# Patient Record
Sex: Male | Born: 1975
Health system: Southern US, Community
[De-identification: ages and names within clinical notes are randomized; demographics above are authoritative.]

## PROBLEM LIST (undated history)

## (undated) DIAGNOSIS — R42 Dizziness and giddiness: Secondary | ICD-10-CM

## (undated) DIAGNOSIS — B019 Varicella without complication: Secondary | ICD-10-CM

## (undated) DIAGNOSIS — K625 Hemorrhage of anus and rectum: Secondary | ICD-10-CM

## (undated) DIAGNOSIS — R51 Headache: Secondary | ICD-10-CM

## (undated) DIAGNOSIS — R519 Headache, unspecified: Secondary | ICD-10-CM

## (undated) HISTORY — DX: Headache: R51

## (undated) HISTORY — DX: Hemorrhage of anus and rectum: K62.5

## (undated) HISTORY — DX: Varicella without complication: B01.9

## (undated) HISTORY — DX: Headache, unspecified: R51.9

## (undated) HISTORY — DX: Dizziness and giddiness: R42

---

## 2014-05-26 ENCOUNTER — Emergency Department: Payer: Self-pay | Admitting: Emergency Medicine

## 2015-06-23 ENCOUNTER — Emergency Department
Admission: EM | Admit: 2015-06-23 | Discharge: 2015-06-24 | Disposition: A | Discharge status: Home / Self Care | Payer: BLUE CROSS/BLUE SHIELD | Attending: Emergency Medicine | Admitting: Emergency Medicine

## 2015-06-23 ENCOUNTER — Encounter: Payer: Self-pay | Admitting: Emergency Medicine

## 2015-06-23 DIAGNOSIS — R42 Dizziness and giddiness: Secondary | ICD-10-CM | POA: Diagnosis present

## 2015-06-23 MED ORDER — SODIUM CHLORIDE 0.9 % IV BOLUS (SEPSIS)
1000.0000 mL | Freq: Once | INTRAVENOUS | Status: AC
  Administered 2015-06-23: 1000 mL via INTRAVENOUS

## 2015-06-23 NOTE — ED Provider Notes (Signed)
[facility] Emergency Department Provider Note  ____________________________________________  Time seen: 11:30 PM  I have reviewed the triage vital signs and the nursing notes.   HISTORY  Chief Complaint Dizziness     HPI Tyler Salinas is a 39 y.o. male presents with acute onset of dizziness this evening while at home. Patient states he was sitting at a time with no change in position. Patient denies any vomiting or diarrhea. Patient denies any strenuous activity today normal by mouth intake. Patient denies any preceding symptoms no chest pain or palpitations. Patient does admit to headaches weekly for which she's never been evaluated. Patient denies any family history of cerebral aneurysms or cardiac disease. Patient denies any weakness numbness or visual changes.    Past medical history None There are no active problems to display for this patient.   Past surgical history None No current outpatient prescriptions on file.  Allergies Review of patient's allergies indicates no known allergies.  No family history on file.  Social History Social History  Substance Use Topics  . Smoking status: Never Smoker   . Smokeless tobacco: None  . Alcohol Use: No    Review of Systems  Constitutional: Negative for fever. Eyes: Negative for visual changes. ENT: Negative for sore throat. Cardiovascular: Negative for chest pain. Respiratory: Negative for shortness of breath. Gastrointestinal: Negative for abdominal pain, vomiting and diarrhea. Genitourinary: Negative for dysuria. Musculoskeletal: Negative for back pain. Skin: Negative for rash. Neurological: Negative for headaches, focal weakness or numbness. Positive for dizziness   10-point ROS otherwise negative.  ____________________________________________   PHYSICAL EXAM:  VITAL SIGNS: ED Triage Vitals  Enc Vitals Group     BP 06/23/15 2246 133/72 mmHg     Pulse Rate 06/23/15 2246 77   Resp 06/23/15 2246 18     Temp 06/23/15 2246 98.1 F (36.7 C)     Temp Source 06/23/15 2246 Oral     SpO2 06/23/15 2246 98 %     Weight 06/23/15 2246 145 lb (65.772 kg)     Height 06/23/15 2246  (1.702 m)     Head Cir --      Peak Flow --      Pain Score --      Pain Loc --      Pain Edu? --      Excl. in GC? --      Constitutional: Alert and oriented. Well appearing and in no distress. Eyes: Conjunctivae are normal. PERRL. Normal extraocular movements. ENT   Head: Normocephalic and atraumatic.   Nose: No congestion/rhinnorhea.   Mouth/Throat: Mucous membranes are moist.   Neck: No stridor. Hematological/Lymphatic/Immunilogical: No cervical lymphadenopathy. Cardiovascular: Normal rate, regular rhythm. Normal and symmetric distal pulses are present in all extremities. No murmurs, rubs, or gallops. Respiratory: Normal respiratory effort without tachypnea nor retractions. Breath sounds are clear and equal bilaterally. No wheezes/rales/rhonchi. Gastrointestinal: Soft and nontender. No distention. There is no CVA tenderness. Genitourinary: deferred Musculoskeletal: Nontender with normal range of motion in all extremities. No joint effusions.  No lower extremity tenderness nor edema. Neurologic:  Normal speech and language. No gross focal neurologic deficits are appreciated. Speech is normal.  Skin:  Skin is warm, dry and intact. No rash noted. Psychiatric: Mood and affect are normal. Speech and behavior are normal. Patient exhibits appropriate insight and judgment.  ____________________________________________    LABS (pertinent positives/negatives)  Labs Reviewed  COMPREHENSIVE METABOLIC PANEL - Abnormal; Notable for the following:    Glucose, Bld 118 (*)  Creatinine, Ser 1.34 (*)    All other components within normal limits  CBC  TROPONIN I     ____________________________________________   EKG  ED ECG REPORT I, BROWN, [place] N, the attending  physician, personally viewed and interpreted this ECG.   Date: 06/24/2015  EKG Time: 10:55 PM  Rate: 65  Rhythm: Normal sinus rhythm  Axis: None  Intervals: Normal  ST&T Change: None   ____________________________________________    RADIOLOGY  CT Angio Head W/Cm &/Or Wo Cm (Final result) Result time: 06/24/15 02:06:39   Final result by Rad Results In Interface (06/24/15 02:06:39)   Narrative:   CLINICAL DATA: Dizziness and nausea beginning this evening.  EXAM: CT ANGIOGRAPHY HEAD  TECHNIQUE: Multidetector CT imaging of the head was performed using the standard protocol during bolus administration of intravenous contrast. Multiplanar CT image reconstructions and MIPs were obtained to evaluate the vascular anatomy.  CONTRAST: 100mL OMNIPAQUE IOHEXOL 350 MG/ML SOLN  COMPARISON: None.  FINDINGS: CT HEAD  The ventricles and sulci are normal. No intraparenchymal hemorrhage, mass effect nor midline shift. No acute large vascular territory infarcts. No abnormal intracranial enhancement.  No abnormal extra-axial fluid collections. Basal cisterns are patent.  No skull fracture. The included ocular globes and orbital contents are non-suspicious. The mastoid aircells and included paranasal sinuses are well-aerated.  CTA HEAD  Anterior circulation: Normal appearance of the cervical internal carotid arteries, petrous, cavernous and supra clinoid internal carotid arteries. Widely patent anterior communicating artery. Normal appearance of the anterior and middle cerebral arteries.  Posterior circulation: Codominant vertebral arteries with normal appearance of the vertebral arteries, vertebrobasilar junction and basilar artery, as well as main branch vessels. Normal appearance of the posterior cerebral arteries.  No large vessel occlusion, hemodynamically significant stenosis, dissection, luminal irregularity, contrast extravasation or aneurysm within the anterior  nor posterior circulation.  IMPRESSION: Normal CT head with and without contrast.  Normal CTA head.   Electronically Signed By: Awilda Metroourtnay Bloomer M.D. On: 06/24/2015 02:06      INITIAL IMPRESSION / ASSESSMENT AND PLAN / ED COURSE  Pertinent labs & imaging results that were available during my care of the patient were reviewed by me and considered in my medical decision making (see chart for details).  No clear etiology noted for the patient's dizziness. Consider possibility of vertigo as such patient received meclizine 25 mg. In addition patient's creatinine 1.34 question possibility of dehydration. Patient received 1 L normal saline. Patient reassessed before discharge in the emergency department and stated that his symptoms are significantly improved following 1 L IV normal saline ____________________________________________   FINAL CLINICAL IMPRESSION(S) / ED DIAGNOSES  Final diagnoses:  Dizziness      Darci Currentandolph N Brown, MD 06/24/15 [phone]

## 2015-06-23 NOTE — ED Notes (Signed)
MD at bedside. 

## 2015-06-23 NOTE — ED Notes (Signed)
Patient ambulatory to triage with steady gait, without difficulty or distress noted; pt reports onset dizziness this evening accomp by nausea

## 2015-06-24 ENCOUNTER — Emergency Department: Payer: BLUE CROSS/BLUE SHIELD

## 2015-06-24 ENCOUNTER — Encounter: Payer: Self-pay | Admitting: Radiology

## 2015-06-24 LAB — COMPREHENSIVE METABOLIC PANEL
ALT: 32 U/L (ref 17–63)
AST: 34 U/L (ref 15–41)
Albumin: 4.4 g/dL (ref 3.5–5.0)
Alkaline Phosphatase: 58 U/L (ref 38–126)
Anion gap: 5 (ref 5–15)
BUN: 19 mg/dL (ref 6–20)
CO2: 29 mmol/L (ref 22–32)
Calcium: 9.1 mg/dL (ref 8.9–10.3)
Chloride: 105 mmol/L (ref 101–111)
Creatinine, Ser: 1.34 mg/dL — ABNORMAL HIGH (ref 0.61–1.24)
GFR calc Af Amer: >60 mL/min (ref >60)
GFR calc non Af Amer: >60 mL/min (ref >60)
Glucose, Bld: 118 mg/dL — ABNORMAL HIGH (ref 65–99)
Potassium: 3.9 mmol/L (ref 3.5–5.1)
Sodium: 139 mmol/L (ref 135–145)
Total Bilirubin: 0.5 mg/dL (ref 0.3–1.2)
Total Protein: 6.7 g/dL (ref 6.5–8.1)

## 2015-06-24 LAB — CBC
HCT: 43.5 % (ref 40.0–52.0)
Hemoglobin: 15 g/dL (ref 13.0–18.0)
MCH: 30.8 pg (ref 26.0–34.0)
MCHC: 34.4 g/dL (ref 32.0–36.0)
MCV: 89.6 fL (ref 80.0–100.0)
Platelets: 182 10*3/uL (ref 150–440)
RBC: 4.85 MIL/uL (ref 4.40–5.90)
RDW: 12.8 % (ref 11.5–14.5)
WBC: 5.3 10*3/uL (ref 3.8–10.6)

## 2015-06-24 LAB — TROPONIN I: Troponin I: <0.03 ng/mL (ref <0.031)

## 2015-06-24 MED ORDER — MECLIZINE HCL 25 MG PO TABS
25.0000 mg | ORAL_TABLET | Freq: Once | ORAL | Status: AC
  Administered 2015-06-24: 25 mg via ORAL
  Filled 2015-06-24: qty 1

## 2015-06-24 MED ORDER — IOHEXOL 350 MG/ML SOLN
100.0000 mL | Freq: Once | INTRAVENOUS | Status: AC | PRN
  Administered 2015-06-24: 100 mL via INTRAVENOUS

## 2015-06-24 MED ORDER — MECLIZINE HCL 25 MG PO TABS: 25.0000 mg | ORAL_TABLET | Freq: Three times a day (TID) | ORAL | Status: DC | PRN

## 2015-06-24 NOTE — Discharge Instructions (Signed)

## 2015-07-06 ENCOUNTER — Telehealth: Payer: Self-pay | Admitting: Emergency Medicine

## 2015-07-06 NOTE — ED Notes (Signed)
Pt had called me asking for referral to ent at [facility] as his dizziness continues.  He has made appt with ent and with a new pcp, but the pcp appt is later.  i called [facility] ent and they cannot see the care everywhere and need faxed information.  i sent the labs and ct and the md notes to Ether GriffinsJosh Smith PA--this is who will be seeing the patient.

## 2015-07-21 DIAGNOSIS — G43809 Other migraine, not intractable, without status migrainosus: Secondary | ICD-10-CM | POA: Insufficient documentation

## 2015-07-27 ENCOUNTER — Ambulatory Visit (INDEPENDENT_AMBULATORY_CARE_PROVIDER_SITE_OTHER): Payer: BLUE CROSS/BLUE SHIELD | Admitting: Family Medicine

## 2015-07-27 ENCOUNTER — Encounter: Payer: Self-pay | Admitting: Family Medicine

## 2015-07-27 VITALS — BP 112/78 | HR 66 | Temp 98.7°F | Ht 68.7 in | Wt 161.8 lb

## 2015-07-27 DIAGNOSIS — R42 Dizziness and giddiness: Secondary | ICD-10-CM | POA: Insufficient documentation

## 2015-07-27 DIAGNOSIS — R7309 Other abnormal glucose: Secondary | ICD-10-CM

## 2015-07-27 DIAGNOSIS — R748 Abnormal levels of other serum enzymes: Secondary | ICD-10-CM | POA: Diagnosis not present

## 2015-07-27 DIAGNOSIS — Z13220 Encounter for screening for lipoid disorders: Secondary | ICD-10-CM

## 2015-07-27 DIAGNOSIS — G44209 Tension-type headache, unspecified, not intractable: Secondary | ICD-10-CM

## 2015-07-27 DIAGNOSIS — R739 Hyperglycemia, unspecified: Secondary | ICD-10-CM

## 2015-07-27 DIAGNOSIS — R7989 Other specified abnormal findings of blood chemistry: Secondary | ICD-10-CM

## 2015-07-27 LAB — LIPID PANEL
Cholesterol: 195 mg/dL (ref 0–200)
HDL: 47.4 mg/dL (ref >39.00)
LDL Cholesterol: 128 mg/dL — ABNORMAL HIGH (ref 0–99)
NonHDL: 147.2
Total CHOL/HDL Ratio: 4
Triglycerides: 94 mg/dL (ref 0.0–149.0)
VLDL: 18.8 mg/dL (ref 0.0–40.0)

## 2015-07-27 LAB — COMPREHENSIVE METABOLIC PANEL
ALT: 28 U/L (ref 0–53)
AST: 25 U/L (ref 0–37)
Albumin: 4.3 g/dL (ref 3.5–5.2)
Alkaline Phosphatase: 59 U/L (ref 39–117)
BUN: 16 mg/dL (ref 6–23)
CO2: 28 mEq/L (ref 19–32)
Calcium: 9.6 mg/dL (ref 8.4–10.5)
Chloride: 103 mEq/L (ref 96–112)
Creatinine, Ser: 1.03 mg/dL (ref 0.40–1.50)
GFR: 86.13 mL/min (ref >60.00)
Glucose, Bld: 83 mg/dL (ref 70–99)
Potassium: 4.7 mEq/L (ref 3.5–5.1)
Sodium: 138 mEq/L (ref 135–145)
Total Bilirubin: 0.5 mg/dL (ref 0.2–1.2)
Total Protein: 6.9 g/dL (ref 6.0–8.3)

## 2015-07-27 LAB — HEMOGLOBIN A1C: Hgb A1c MFr Bld: 5.3 % (ref 4.6–6.5)

## 2015-07-27 NOTE — Progress Notes (Signed)
Pre visit review using our clinic review tool, if applicable. No additional management support is needed unless otherwise documented below in the visit note. 

## 2015-07-27 NOTE — Assessment & Plan Note (Signed)
Headaches are tension in nature. He is neurologically intact. 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 He can continue ibuprofen as needed for headaches. He is advised to monitor for additional symptoms. He is given return precautions.

## 2015-07-27 NOTE — Patient Instructions (Signed)
Nice to meet you. Please continue to monitor your lightheadedness. If it recurs please let us know. You can continue to take ibuprofen as needed for headaches. You should wear a mask while around solvents and pains at work. If your headaches change or become more frequent he should follow-up with us. If you develop numbness, weakness, vision changes, light sensitivity, sound sensitivity, sudden onset headache, persistent dizziness, chest pain, shortness breath, or palpitations, or any new or change in symptoms please seek medical attention.

## 2015-07-27 NOTE — Progress Notes (Signed)
Patient ID: Tyler Salinas, male   DOB: [birth year], 39 y.o.   MRN: [id number]  Tyler Rumps, MD Phone: [phone]  Tyler Salinas is a 39 y.o. male who presents today for new patient visit.  Dizziness: Patient notes an episode of dizziness in early November. He notes he was sitting there and became lightheaded and had a spinning sensation. He had not changed positions. He notes this lasted for an hour. He had no tinnitus, ear fullness, or ear pain. He had no chest pain, shortness of breath, or palpitations. He had no headache with this. He notes after this lasted for about half an hour he went to the emergency room to be evaluated for this. He had a CT angiogram of his head that did not reveal a cause. He had an EKG that did not reveal a cause. He had a troponin that was negative. He had a CMP that revealed a mildly elevated blood sugar and a mildly elevated creatinine. He had a negative CBC. He received 1 L of normal saline and noted his symptoms improved. Since that time he has had one additional episode lasting about 20 minutes. This occurred a little less than a month ago. It occurred while he was standing still. He had no other symptoms with this. He notes he took Antivert and this was beneficial. After the second episode he saw [facility] for evaluation. States they advised him that this is likely related to migraines. He does note he has a history of headaches for several years. He notes they are bitemporal and aching in nature. They're gradual in onset. They're associated with him feeling fatigued from work, tired, or stressed. They're not associated with photophobia or phonophobia. He has no numbness, weakness, or vision changes with these. He notes they resolve with taking aspirin or ibuprofen. He notes the headaches occur a few times a week depending on how much sleep and stress he is under at work. 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  Active Ambulatory Problems    Diagnosis Date Noted  . Dizziness 07/27/2015  . Tension headache 07/27/2015   Resolved Ambulatory Problems    Diagnosis Date Noted  . No Resolved Ambulatory Problems   Past Medical History  Diagnosis Date  . Chickenpox   . Headache   . Vertigo     Family History  Problem Relation Age of Onset  . Diabetes      Grandparent    Social History   Social History  . Marital Status: Married    Spouse Name: N/A  . Number of Children: N/A  . Years of Education: N/A   Occupational History  . Not on file.   Social History Main Topics  . Smoking status: Former Research scientist (life sciences)  . Smokeless tobacco: Not on file  . Alcohol Use: 0.6 oz/week    1 Standard drinks or equivalent per week  . Drug Use: No  . Sexual Activity: Not on file   Other Topics Concern  . Not on file   Social History Narrative    ROS   General:  Negative for nexplained weight loss, fever Skin: Negative for new or changing mole, sore that won't heal HEENT: Negative for trouble hearing, trouble seeing, ringing in ears, mouth sores, hoarseness, change in voice, dysphagia. CV:  Negative for chest pain, dyspnea, edema, palpitations Resp: Negative for cough, dyspnea, hemoptysis GI: Negative for nausea,  vomiting, diarrhea, constipation, abdominal pain, melena, hematochezia. GU: Negative for dysuria, incontinence, urinary hesitance, hematuria, vaginal or penile discharge, polyuria, sexual difficulty, lumps in testicle or breasts MSK: Negative for muscle cramps or aches, joint pain or swelling Neuro: Positive for headaches and dizziness, Negative for  weakness, numbness, passing out/fainting Psych: Negative for depression, anxiety, memory problems    Objective  Physical Exam Filed Vitals:   07/27/15 0904  BP: 112/78  Pulse: 66  Temp: 98.7 F (37.1 C)   Laying blood pressure 106/78 pulse 65  Sitting blood pressure 104/76 pulse 69 Standing blood pressure  108/84 pulse 66  Physical Exam  Constitutional: He is well-developed, well-nourished, and in no distress.  HENT:  Head: Normocephalic and atraumatic.  Right Ear: External ear normal.  Left Ear: External ear normal.  Mouth/Throat: Oropharynx is clear and moist. No oropharyngeal exudate.  Normal TMs bilaterally  Eyes: Conjunctivae are normal. Pupils are equal, round, and reactive to light.  Neck: Neck supple.  Cardiovascular: Normal rate, regular rhythm and normal heart sounds.  Exam reveals no gallop and no friction rub.   No murmur heard. Pulmonary/Chest: Effort normal and breath sounds normal. No respiratory distress. He has no wheezes. He has no rales.  Abdominal: Soft. Bowel sounds are normal. He exhibits no distension. There is no tenderness. There is no rebound and no guarding.  Musculoskeletal: He exhibits no edema.  Lymphadenopathy:    He has no cervical adenopathy.  Neurological: He is alert.  CN 2-12 intact, 5/5 strength in bilateral biceps, triceps, grip, quads, hamstrings, plantar and dorsiflexion, sensation to light touch intact in bilateral UE and LE, normal gait, 2+ patellar reflexes  Skin: Skin is warm and dry. He is not diaphoretic.  Psychiatric: Mood and affect normal.     Assessment/Plan:   Dizziness Patient describes sensation of possible lightheadedness versus possible vertigo. He has no associated symptoms with this. He underwent extensive workup in the ED for this without cause found and has also seen ENT. Patient states ENT advised that he could undergo vestibular testing to further evaluate this, though they wanted him to be evaluated for his headaches first. This issue does not seem to be associated with his headaches. His headaches are not typical of migraine headaches. They are most typical of tension headaches. He is neurologically intact today. Discussed with patient that this could be related to dehydration given his elevated creatinine in the ED and  improvement of the initial episode with hydration. Also discussed that this could be a vestibular issue. Doubt central nervous system process given negative CT of head and normal neurological exam. Doubt cardiac cause given lack of cardiac symptoms and negative EKG. Discussed potentially going back to ENT to complete vestibular workup versus monitoring. Patient opts to monitor at this time. If this recurs would have him complete vestibular workup with ENT. If that is negative would consider Holter monitor versus neurology consult, though cardiac and neurological causes seem less likely. He is given return precautions.  Tension headache Headaches are tension in nature. He is neurologically intact. 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 He can continue ibuprofen as needed for headaches. He is advised to monitor for additional symptoms. He is given return precautions.   we will check a CMP and A1c to follow-up on his lab work from the ED.  Orders Placed This Encounter  Procedures  . Comp Met (CMET)  . Lipid Profile  .  HgB A1c    Tyler Salinas

## 2015-07-27 NOTE — Assessment & Plan Note (Signed)
Patient describes sensation of possible lightheadedness versus possible vertigo. He has no associated symptoms with this. He underwent extensive workup in the ED for this without cause found and has also seen ENT. Patient states ENT advised that he could undergo vestibular testing to further evaluate this, though they wanted him to be evaluated for his headaches first. This issue does not seem to be associated with his headaches. His headaches are not typical of migraine headaches. They are most typical of tension headaches. He is neurologically intact today. Discussed with patient that this could be related to dehydration given his elevated creatinine in the ED and improvement of the initial episode with hydration. Also discussed that this could be a vestibular issue. Doubt central nervous system process given negative CT of head and normal neurological exam. Doubt cardiac cause given lack of cardiac symptoms and negative EKG. Discussed potentially going back to ENT to complete vestibular workup versus monitoring. Patient opts to monitor at this time. If this recurs would have him complete vestibular workup with ENT. If that is negative would consider Holter monitor versus neurology consult, though cardiac and neurological causes seem less likely. He is given return precautions.

## 2015-10-29 ENCOUNTER — Encounter: Payer: Self-pay | Admitting: Family Medicine

## 2015-10-29 ENCOUNTER — Ambulatory Visit (INDEPENDENT_AMBULATORY_CARE_PROVIDER_SITE_OTHER): Payer: BLUE CROSS/BLUE SHIELD | Admitting: Family Medicine

## 2015-10-29 VITALS — BP 102/78 | HR 67 | Temp 98.2°F | Wt 161.1 lb

## 2015-10-29 DIAGNOSIS — R42 Dizziness and giddiness: Secondary | ICD-10-CM

## 2015-10-29 DIAGNOSIS — G44209 Tension-type headache, unspecified, not intractable: Secondary | ICD-10-CM | POA: Diagnosis not present

## 2015-10-29 DIAGNOSIS — J069 Acute upper respiratory infection, unspecified: Secondary | ICD-10-CM | POA: Diagnosis not present

## 2015-10-29 DIAGNOSIS — E7800 Pure hypercholesterolemia, unspecified: Secondary | ICD-10-CM | POA: Diagnosis not present

## 2015-10-29 LAB — LDL CHOLESTEROL, DIRECT: Direct LDL: 114 mg/dL

## 2015-10-29 NOTE — Progress Notes (Signed)
Pre visit review using our clinic review tool, if applicable. No additional management support is needed unless otherwise documented below in the visit note. 

## 2015-10-29 NOTE — Assessment & Plan Note (Signed)
The significantly improved. Asymptomatic recently. He'll continue to monitor. Antivert as needed. Given return precautions.

## 2015-10-29 NOTE — Assessment & Plan Note (Signed)
Infrequently occurring. Patient will continue to monitor. He is neurologically intact. Given return precautions.

## 2015-10-29 NOTE — Assessment & Plan Note (Signed)
Symptoms and exam consistent with most likely viral upper respiratory infection, less likely allergic rhinitis. Patient is taking Benadryl well with this. He'll continue to monitor. As needed Benadryl. Given return precautions.

## 2015-10-29 NOTE — Assessment & Plan Note (Signed)
Mildly elevated LDL on last check. Patient is changed his diet. He'll continue to be physically active. We will check a direct LDL today.

## 2015-10-29 NOTE — Progress Notes (Signed)
Patient ID: Tyler Salinas, male   DOB: 04/02/1976, 40 y.o.   MRN: [id number]  Tyler AlarEric Anel Creighton, MD Phone: [phone]  Tyler Salinas is a 40 y.o. male who presents today for follow-up.  Elevated LDL: Patient reports that he has changed his diet by not eating out for lunch. He is typically taking sandwich, apples, and carrots for lunch. Notes he is quite physical at his job though does not do specific exercise. They're also cooking more at home. He denies chest pain or shortness of breath.  Tension headaches: Patient notes this has not been much of an issue since his last visit. Notes very infrequently he gets them with a bitemporal achy sensation. No numbness, weakness, or vision changes with these.  Lightheadedness: This is significantly improved as well. Very infrequently he'll get them at the end of the day if he is stressed out or too tired. Lasts very briefly and goes away with sitting down and drinking water. He has not had to take any of the Antivert. He gets no other symptoms with this. No chest pain, short of breath or palpitations with this.  Upper respiratory infection: Patient notes yesterday started with scratchy itchy throat and postnasal drip with some congestion. No fevers. No ear discomfort. No cough. No shortness of breath. Notes his household has been sick recently. He does not have a history of allergies.  PMH: Former smoker   ROS see history of present illness  Objective  Physical Exam Filed Vitals:   10/29/15 0800  BP: 102/78  Pulse: 67  Temp: 98.2 F (36.8 C)    BP Readings from Last 3 Encounters:  10/29/15 102/78  07/27/15 112/78  06/24/15 114/71   Wt Readings from Last 3 Encounters:  10/29/15 161 lb 2 oz (73.086 kg)  07/27/15 161 lb 12.8 oz (73.392 kg)  06/23/15 145 lb (65.772 kg)    Physical Exam  Constitutional: He is well-developed, well-nourished, and in no distress.  HENT:  Head: Normocephalic and atraumatic.  Right Ear: External ear normal.  Left  Ear: External ear normal.  Mouth/Throat: No oropharyngeal exudate.  Normal TMs bilaterally, mild posterior oropharyngeal erythema with postnasal drip  Eyes: Conjunctivae are normal. Pupils are equal, round, and reactive to light.  Neck: Neck supple.  Cardiovascular: Normal rate, regular rhythm and normal heart sounds.  Exam reveals no gallop and no friction rub.   No murmur heard. Pulmonary/Chest: Effort normal and breath sounds normal. No respiratory distress. He has no wheezes. He has no rales.  Lymphadenopathy:    He has no cervical adenopathy.  Neurological: He is alert.  CN 2-12 intact, 5/5 strength in bilateral biceps, triceps, grip, quads, hamstrings, plantar and dorsiflexion, sensation to light touch intact in bilateral UE and LE, normal gait, 2+ patellar reflexes  Skin: Skin is warm and dry. He is not diaphoretic.     Assessment/Plan: Please see individual problem list.  Dizziness The significantly improved. Asymptomatic recently. He'll continue to monitor. Antivert as needed. Given return precautions.  Tension headache Infrequently occurring. Patient will continue to monitor. He is neurologically intact. Given return precautions.  Elevated LDL cholesterol level Mildly elevated LDL on last check. Patient is changed his diet. He'll continue to be physically active. We will check a direct LDL today.  URI (upper respiratory infection) Symptoms and exam consistent with most likely viral upper respiratory infection, less likely allergic rhinitis. Patient is taking Benadryl well with this. He'll continue to monitor. As needed Benadryl. Given return precautions.    Orders Placed  This Encounter  Procedures  . Direct LDL     Tyler Alar, MD [facility]

## 2015-10-29 NOTE — Patient Instructions (Signed)
Nice to see you. Great job with diet. Please continue this. Please continue to be physically active as well. Monitor your upper respiratory symptoms and if they worsen or you develop fevers or persistent symptoms please let us know. Monitor for headaches and lightheadedness and if these become more persistent or prominent please let us know as well. If you develop numbness, weakness, vision changes, persistent lightheadedness, chest pain, shortness breath, palpitations, or any new or changing symptoms please seek medical attention.

## 2016-05-05 ENCOUNTER — Ambulatory Visit: Payer: BLUE CROSS/BLUE SHIELD | Admitting: Family Medicine

## 2016-06-02 ENCOUNTER — Encounter: Payer: Self-pay | Admitting: Family Medicine

## 2016-06-02 ENCOUNTER — Ambulatory Visit (INDEPENDENT_AMBULATORY_CARE_PROVIDER_SITE_OTHER): Payer: BLUE CROSS/BLUE SHIELD | Admitting: Family Medicine

## 2016-06-02 ENCOUNTER — Encounter: Payer: Self-pay | Admitting: *Deleted

## 2016-06-02 VITALS — BP 110/76 | HR 57 | Temp 98.3°F | Wt 158.4 lb

## 2016-06-02 DIAGNOSIS — Z23 Encounter for immunization: Secondary | ICD-10-CM

## 2016-06-02 DIAGNOSIS — E7800 Pure hypercholesterolemia, unspecified: Secondary | ICD-10-CM | POA: Diagnosis not present

## 2016-06-02 LAB — LDL CHOLESTEROL, DIRECT: Direct LDL: 113 mg/dL

## 2016-06-02 NOTE — Patient Instructions (Signed)
Nice to see you. We will check your cholesterol today. We'll call you with the results.

## 2016-06-02 NOTE — Assessment & Plan Note (Signed)
Asymptomatic. He'll continue to work on diet and exercise. We'll check a direct LDL today.

## 2016-06-02 NOTE — Progress Notes (Signed)
  Marikay AlarEric Araceli Arango, MD Phone: [phone]  Roe Rutherfordric Anguiano is a 40 y.o. male who presents today for follow-up.  Elevated LDL cholesterol: Patient notes his diet is a same as previously. Over the summer and this fall he started walking and pushing his son in a tricycle. Did this most days. Walked for about a mile. No chest pain or shortness of breath.  Lightheadedness: Has not had any recurrence. Has not had to use Antivert.  ROS see history of present illness  Objective  Physical Exam Vitals:   06/02/16 1039  BP: 110/76  Pulse: (!) 57  Temp: 98.3 F (36.8 C)    BP Readings from Last 3 Encounters:  06/02/16 110/76  10/29/15 102/78  07/27/15 112/78   Wt Readings from Last 3 Encounters:  06/02/16 158 lb 6.4 oz (71.8 kg)  10/29/15 161 lb 2 oz (73.1 kg)  07/27/15 161 lb 12.8 oz (73.4 kg)    Physical Exam  Constitutional: He is well-developed, well-nourished, and in no distress.  Cardiovascular: Normal rate, regular rhythm and normal heart sounds.   Pulmonary/Chest: Effort normal and breath sounds normal.  Neurological: He is alert. Gait normal.  Skin: Skin is warm and dry.     Assessment/Plan: Please see individual problem list.  Elevated LDL cholesterol level Asymptomatic. He'll continue to work on diet and exercise. We'll check a direct LDL today.  Dizziness No recurrence. Monitor for recurrence.   Orders Placed This Encounter  Procedures  . Flu Vaccine QUAD 36+ mos IM  . Direct LDL    Marikay AlarEric Kidada Ging, MD [facility]

## 2016-06-02 NOTE — Progress Notes (Signed)
Pre visit review using our clinic review tool, if applicable. No additional management support is needed unless otherwise documented below in the visit note. 

## 2016-06-02 NOTE — Assessment & Plan Note (Signed)
No recurrence.  Monitor for recurrence. 

## 2016-07-27 DIAGNOSIS — J029 Acute pharyngitis, unspecified: Secondary | ICD-10-CM | POA: Diagnosis not present

## 2016-09-12 DIAGNOSIS — R110 Nausea: Secondary | ICD-10-CM | POA: Diagnosis not present

## 2017-06-05 ENCOUNTER — Encounter: Payer: Self-pay | Admitting: Family Medicine

## 2017-06-05 ENCOUNTER — Ambulatory Visit (INDEPENDENT_AMBULATORY_CARE_PROVIDER_SITE_OTHER): Payer: BLUE CROSS/BLUE SHIELD | Admitting: Family Medicine

## 2017-06-05 VITALS — BP 112/78 | HR 71 | Temp 98.4°F | Wt 160.8 lb

## 2017-06-05 DIAGNOSIS — Z13220 Encounter for screening for lipoid disorders: Secondary | ICD-10-CM

## 2017-06-05 DIAGNOSIS — Z0000 Encounter for general adult medical examination without abnormal findings: Secondary | ICD-10-CM | POA: Diagnosis not present

## 2017-06-05 LAB — COMPREHENSIVE METABOLIC PANEL
ALT: 19 U/L (ref 0–53)
AST: 19 U/L (ref 0–37)
Albumin: 4.5 g/dL (ref 3.5–5.2)
Alkaline Phosphatase: 58 U/L (ref 39–117)
BUN: 16 mg/dL (ref 6–23)
CO2: 30 mEq/L (ref 19–32)
Calcium: 9.7 mg/dL (ref 8.4–10.5)
Chloride: 103 mEq/L (ref 96–112)
Creatinine, Ser: 1.15 mg/dL (ref 0.40–1.50)
GFR: 75.1 mL/min (ref >60.00)
Glucose, Bld: 99 mg/dL (ref 70–99)
Potassium: 4.8 mEq/L (ref 3.5–5.1)
Sodium: 140 mEq/L (ref 135–145)
Total Bilirubin: 0.5 mg/dL (ref 0.2–1.2)
Total Protein: 7.1 g/dL (ref 6.0–8.3)

## 2017-06-05 LAB — LIPID PANEL
Cholesterol: 185 mg/dL (ref 0–200)
HDL: 47.4 mg/dL (ref >39.00)
LDL Cholesterol: 117 mg/dL — ABNORMAL HIGH (ref 0–99)
NonHDL: 138.01
Total CHOL/HDL Ratio: 4
Triglycerides: 105 mg/dL (ref 0.0–149.0)
VLDL: 21 mg/dL (ref 0.0–40.0)

## 2017-06-05 NOTE — Assessment & Plan Note (Signed)
Physical exam completed. Tyler Salinas diet and exercise. Health maintenance brought up-to-date. Encouraged to monitor his stress levels given how much he has going on at work and home. Advised if he develops anxiety or stress lab work as outlined below.

## 2017-06-05 NOTE — Patient Instructions (Signed)
Nice to see you. We'll check lab work in contact with the results. Please try to make healthy choices with eating. When you do eat out try to choose healthier options.

## 2017-06-05 NOTE — Progress Notes (Signed)
Tommi Rumps, MD Phone: [phone]  Tyler Salinas is a 41 y.o. male who presents today for physical exam.  States physically active at work. 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 They've tried to get back to cooking home-cooked meals. He is eating healthier at home though does eat out at lunch often. Burger and fries mostly. No sweet tea or soda. Tetanus vaccination 2009. Flu shot last week. HIV testing in 2004. No family history of cancer. Smokes 1 pack per day for 8-9 years. Quit in 2009. Occasional alcohol use. No illicit drug use.  Sees a dentist. Sees an ophthalmologist. He does note some stress related to working 2 jobs and caring for a medically fragile child. 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  Active Ambulatory Problems    Diagnosis Date Noted  . Tension headache 07/27/2015  . Elevated LDL cholesterol level 10/29/2015  . Routine general medical examination at a health care facility 06/05/2017   Resolved Ambulatory Problems    Diagnosis Date Noted  . Dizziness 07/27/2015  . URI (upper respiratory infection) 10/29/2015   Past Medical History:  Diagnosis Date  . Chickenpox   . Headache   . Vertigo     Family History  Problem Relation Age of Onset  . Diabetes Unknown        Grandparent    Social History   Social History  . Marital status: Married    Spouse name: N/A  . Number of children: N/A  . Years of education: N/A   Occupational History  . Not on file.   Social History Main Topics  . Smoking status: Former Research scientist (life sciences)  . Smokeless tobacco: Never Used  . Alcohol use 0.6 oz/week    1 Standard drinks or equivalent per week  . Drug use: No  . Sexual activity: Not on file   Other Topics Concern  . Not on file   Social History Narrative  . No narrative on file    ROS  General:  Negative for nexplained weight loss, fever Skin: Negative for new or changing mole, sore that won't heal HEENT: Negative for trouble hearing,  trouble seeing, ringing in ears, mouth sores, hoarseness, change in voice, dysphagia. CV:  Negative for chest pain, dyspnea, edema, palpitations Resp: Negative for cough, dyspnea, hemoptysis GI: Negative for nausea, vomiting, diarrhea, constipation, abdominal pain, melena, hematochezia. GU: Negative for dysuria, incontinence, urinary hesitance, hematuria, vaginal or penile discharge, polyuria, sexual difficulty, lumps in testicle or breasts MSK: Negative for muscle cramps or aches, joint pain or swelling Neuro: Negative for headaches, weakness, numbness, dizziness, passing out/fainting Psych: Negative for depression, anxiety, memory problems  Objective  Physical Exam Vitals:   06/05/17 0758  BP: 112/78  Pulse: 71  Temp: 98.4 F (36.9 C)  SpO2: 98%    BP Readings from Last 3 Encounters:  06/05/17 112/78  06/02/16 110/76  10/29/15 102/78   Wt Readings from Last 3 Encounters:  06/05/17 160 lb 12.8 oz (72.9 kg)  06/02/16 158 lb 6.4 oz (71.8 kg)  10/29/15 161 lb 2 oz (73.1 kg)    Physical Exam  Constitutional: No distress.  HENT:  Head: Normocephalic and atraumatic.  Mouth/Throat: Oropharynx is clear and moist. No oropharyngeal exudate.  Eyes: Pupils are equal, round, and reactive to light. Conjunctivae are normal.  Neck: Neck supple.  Cardiovascular: Normal rate, regular rhythm and normal heart sounds.   Pulmonary/Chest: Effort normal and breath sounds normal.  Abdominal: Soft. Bowel sounds are normal. He exhibits no distension. There is  no tenderness. There is no rebound and no guarding.  Musculoskeletal: He exhibits no edema.  Lymphadenopathy:    He has no cervical adenopathy.  Neurological: He is alert. Gait normal.  Skin: Skin is warm and dry. He is not diaphoretic.  Psychiatric: Mood and affect normal.     Assessment/Plan:   Routine general medical examination at a health care facility Physical exam completed. [facility] diet and exercise. Health maintenance brought  up-to-date. Encouraged to monitor his stress levels given how much he has going on at work and home. Advised if he develops anxiety or stress lab work as outlined below.   Orders Placed This Encounter  Procedures  . Lipid panel  . Comp Met (CMET)    No orders of the defined types were placed in this encounter.    Tommi Rumps, MD [place]

## 2017-06-08 DIAGNOSIS — J029 Acute pharyngitis, unspecified: Secondary | ICD-10-CM | POA: Diagnosis not present

## 2017-06-20 DIAGNOSIS — D225 Melanocytic nevi of trunk: Secondary | ICD-10-CM | POA: Diagnosis not present

## 2017-09-24 IMAGING — CT CT ANGIO HEAD
4 of 9 series · 18 of 47 positions shown · IV contrast (APPLIED)
Comparison: None.

CLINICAL DATA: Dizziness and nausea beginning this evening.

EXAM:
CT ANGIOGRAPHY HEAD
TECHNIQUE: Multidetector CT imaging of the head was performed using the
standard protocol during bolus administration of intravenous
contrast. Multiplanar CT image reconstructions and MIPs were
obtained to evaluate the vascular anatomy.
CONTRAST:  100mL OMNIPAQUE IOHEXOL 350 MG/ML SOLN

[Series 6: cta head · axial · 0.42mm/px · z∈[-16,+120]mm · 10 of 165 slices shown]
[im 15/165  brain]
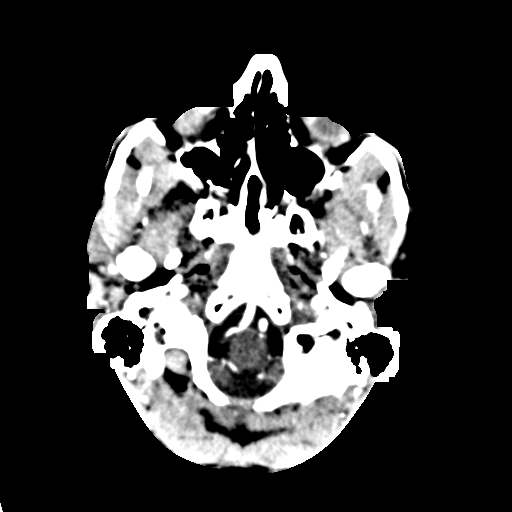
[im 30/165  bone]
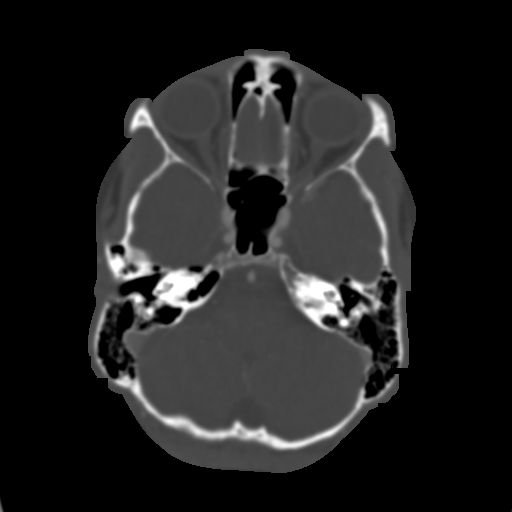
[im 45/165  brain]
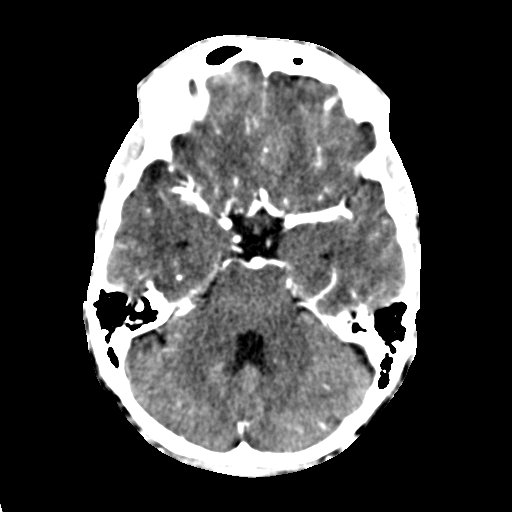
[im 60/165  bone]
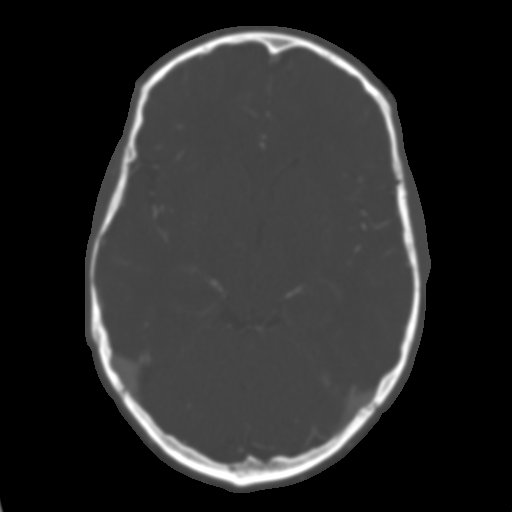
[im 75/165  brain]
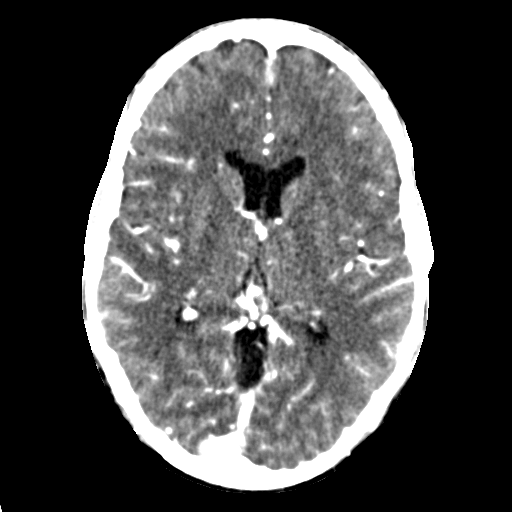
[im 90/165  bone]
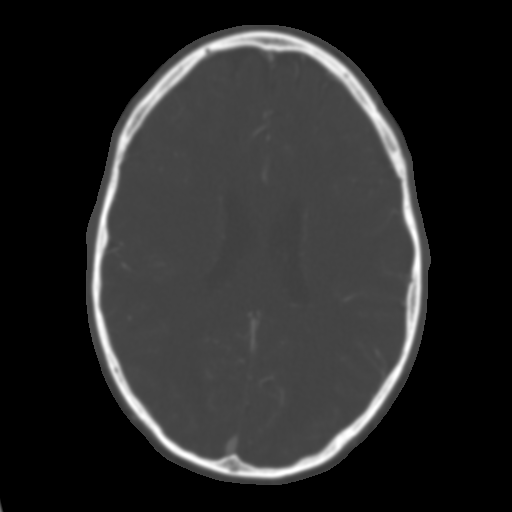
[im 105/165  brain]
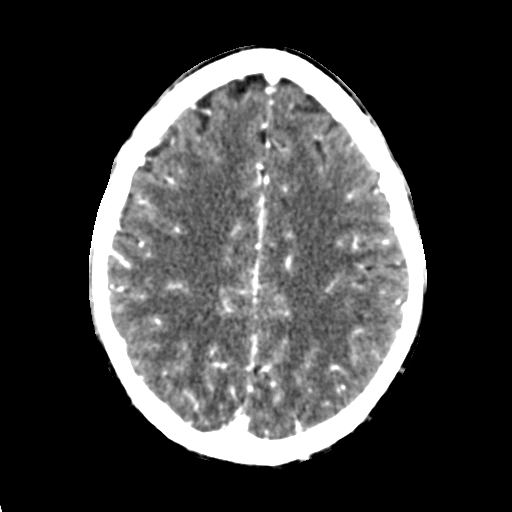
[im 120/165  bone]
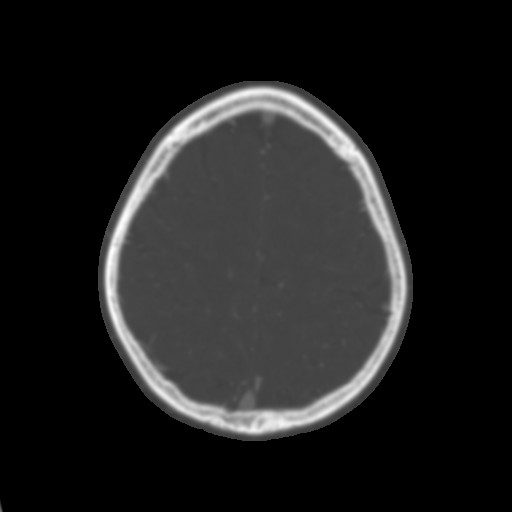
[im 135/165  brain]
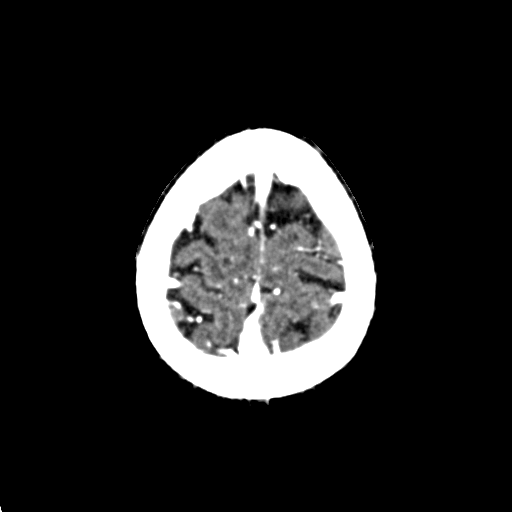
[im 150/165  bone]
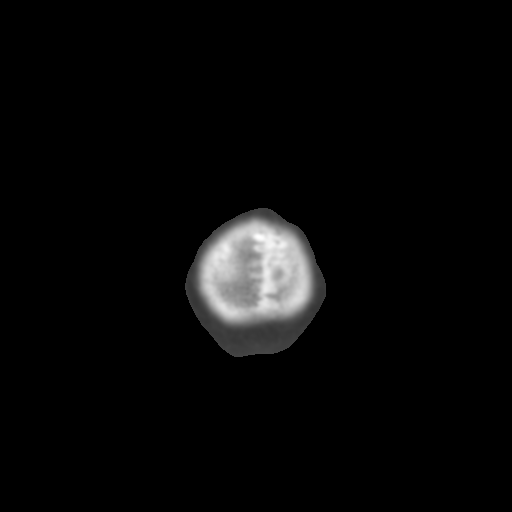

[Series 7: ax thin · axial · 0.43mm/px · z∈[+4,+18]mm · 2 of 165 slices shown]
[im 15/165  brain]
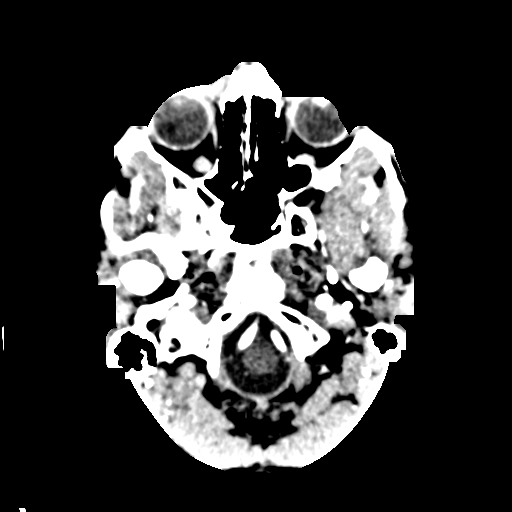
[im 30/165  brain]
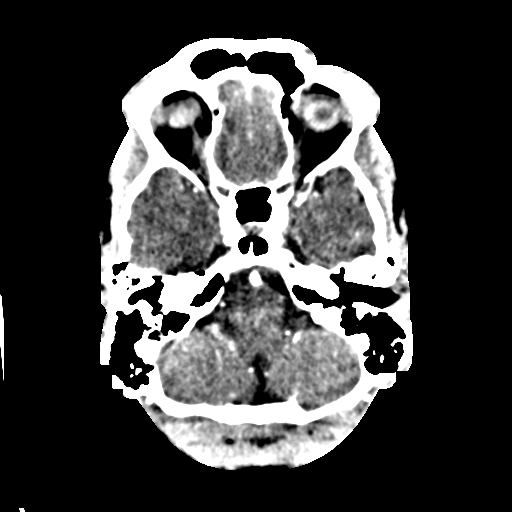

[Series 9: cor thin · coronal · 0.36mm/px · 3 of 206 slices shown]
[im 59/206  brain]
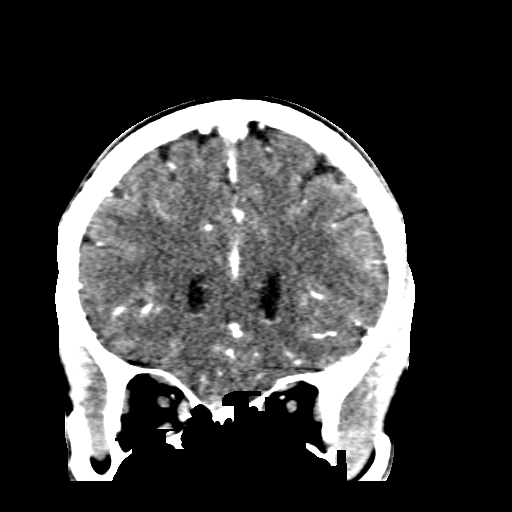
[im 88/206  brain]
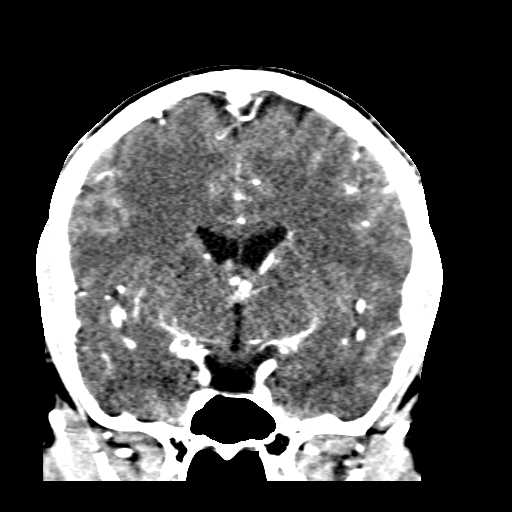
[im 118/206  brain]
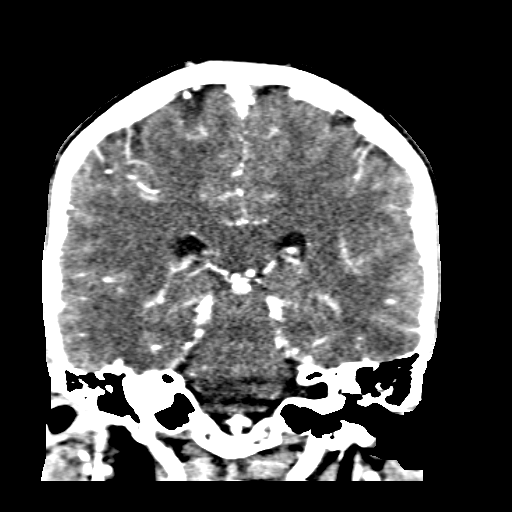

[Series 11: sag thin · sagittal · 0.35mm/px · 3 of 171 slices shown]
[im 35/171  brain]
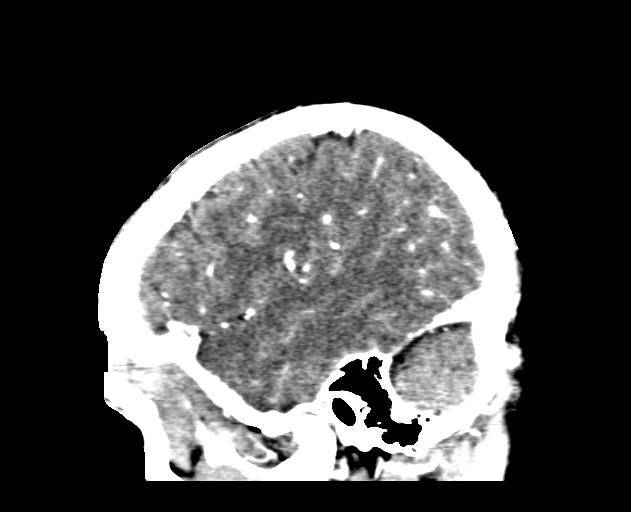
[im 69/171  brain]
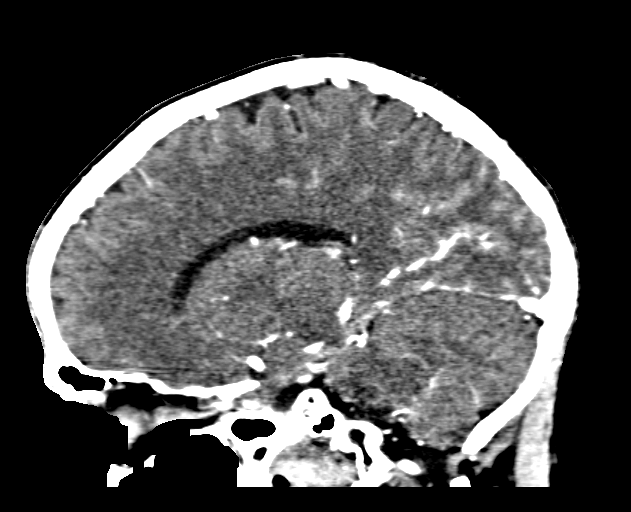
[im 103/171  brain]
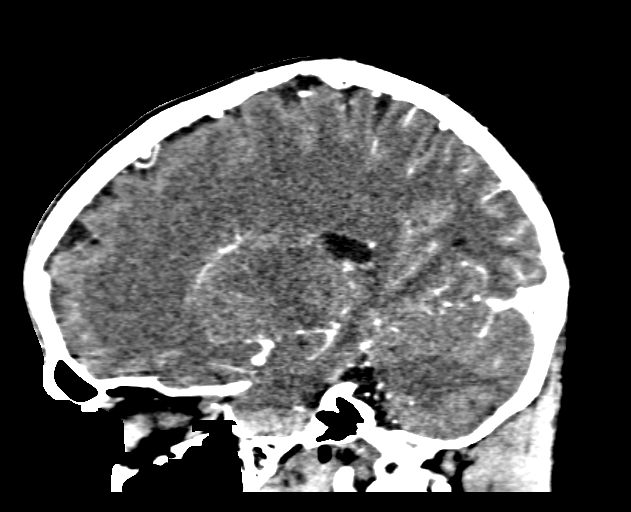

[18 of 47 positions shown; findings below may reference images not displayed]

FINDINGS: CT HEAD

The ventricles and sulci are normal. No intraparenchymal hemorrhage,
mass effect nor midline shift. No acute large vascular territory
infarcts. No abnormal intracranial enhancement.

No abnormal extra-axial fluid collections. Basal cisterns are
patent.

No skull fracture. The included ocular globes and orbital contents
are non-suspicious. The mastoid aircells and included paranasal
sinuses are well-aerated.

CTA HEAD

Anterior circulation: Normal appearance of the cervical internal
carotid arteries, petrous, cavernous and supra clinoid internal
carotid arteries. Widely patent anterior communicating artery.
Normal appearance of the anterior and middle cerebral arteries.

Posterior circulation: Codominant vertebral arteries with normal
appearance of the vertebral arteries, vertebrobasilar junction and
basilar artery, as well as main branch vessels. Normal appearance of
the posterior cerebral arteries.

No large vessel occlusion, hemodynamically significant stenosis,
dissection, luminal irregularity, contrast extravasation or aneurysm
within the anterior nor posterior circulation.
IMPRESSION: Normal CT head with and without contrast.

Normal CTA head.

## 2018-06-06 ENCOUNTER — Ambulatory Visit (INDEPENDENT_AMBULATORY_CARE_PROVIDER_SITE_OTHER): Payer: BLUE CROSS/BLUE SHIELD | Admitting: Family Medicine

## 2018-06-06 ENCOUNTER — Encounter: Payer: Self-pay | Admitting: Family Medicine

## 2018-06-06 VITALS — BP 92/60 | HR 76 | Temp 98.4°F | Ht 67.5 in | Wt 167.4 lb

## 2018-06-06 DIAGNOSIS — E663 Overweight: Secondary | ICD-10-CM

## 2018-06-06 DIAGNOSIS — Z23 Encounter for immunization: Secondary | ICD-10-CM | POA: Diagnosis not present

## 2018-06-06 DIAGNOSIS — K625 Hemorrhage of anus and rectum: Secondary | ICD-10-CM

## 2018-06-06 DIAGNOSIS — Z0000 Encounter for general adult medical examination without abnormal findings: Secondary | ICD-10-CM | POA: Diagnosis not present

## 2018-06-06 DIAGNOSIS — G44209 Tension-type headache, unspecified, not intractable: Secondary | ICD-10-CM

## 2018-06-06 DIAGNOSIS — E7800 Pure hypercholesterolemia, unspecified: Secondary | ICD-10-CM

## 2018-06-06 LAB — CBC
HCT: 45 % (ref 39.0–52.0)
Hemoglobin: 15.6 g/dL (ref 13.0–17.0)
MCHC: 34.7 g/dL (ref 30.0–36.0)
MCV: 90.5 fl (ref 78.0–100.0)
Platelets: 241 10*3/uL (ref 150.0–400.0)
RBC: 4.98 Mil/uL (ref 4.22–5.81)
RDW: 13.2 % (ref 11.5–15.5)
WBC: 5.9 10*3/uL (ref 4.0–10.5)

## 2018-06-06 LAB — COMPREHENSIVE METABOLIC PANEL
ALT: 21 U/L (ref 0–53)
AST: 18 U/L (ref 0–37)
Albumin: 4.6 g/dL (ref 3.5–5.2)
Alkaline Phosphatase: 64 U/L (ref 39–117)
BUN: 20 mg/dL (ref 6–23)
CO2: 30 mEq/L (ref 19–32)
Calcium: 9.8 mg/dL (ref 8.4–10.5)
Chloride: 103 mEq/L (ref 96–112)
Creatinine, Ser: 1.2 mg/dL (ref 0.40–1.50)
GFR: 71.14 mL/min (ref >60.00)
Glucose, Bld: 100 mg/dL — ABNORMAL HIGH (ref 70–99)
Potassium: 5 mEq/L (ref 3.5–5.1)
Sodium: 139 mEq/L (ref 135–145)
Total Bilirubin: 0.4 mg/dL (ref 0.2–1.2)
Total Protein: 7.3 g/dL (ref 6.0–8.3)

## 2018-06-06 LAB — LIPID PANEL
Cholesterol: 184 mg/dL (ref 0–200)
HDL: 40.2 mg/dL (ref >39.00)
LDL Cholesterol: 119 mg/dL — ABNORMAL HIGH (ref 0–99)
NonHDL: 143.34
Total CHOL/HDL Ratio: 5
Triglycerides: 121 mg/dL (ref 0.0–149.0)
VLDL: 24.2 mg/dL (ref 0.0–40.0)

## 2018-06-06 LAB — HEMOGLOBIN A1C: Hgb A1c MFr Bld: 5 % (ref 4.6–6.5)

## 2018-06-06 NOTE — Assessment & Plan Note (Signed)
Physical exam completed.  Encouraged adding exercise in several days a week to start.  He will monitor his diet.  Tetanus vaccination and flu vaccination given today.  Lab work as outlined below.

## 2018-06-06 NOTE — Addendum Note (Signed)
Addended by: Gracelyn Nurse on: 06/06/2018 08:38 AM   Modules accepted: Orders

## 2018-06-06 NOTE — Assessment & Plan Note (Signed)
We will refer to GI.  He appears to have possible external hemorrhoids.  We will check a CBC.

## 2018-06-06 NOTE — Progress Notes (Signed)
Tommi Rumps, MD Phone: [phone]  Tyler Salinas is a 42 y.o. male who presents today for cpe.  Exercise: none. Diet: Tries to watch what he eats though does have some fast food.  No sweet tea or soda. No family history of colon cancer or prostate cancer. Tetanus vaccination and flu vaccination due. No tobacco use or illicit drug use.  Rare alcohol use. He sees a dentist 3-4 times a year for gingival disease.  He sees an ophthalmologist once yearly. Recently started a new job at the [employer] in [place].  Patient does report bright red blood when he wipes at times.  He notes this has been going on for as long as he can remember.  Some drips into the toilet.  No change to stool color.  No melena.  No abdominal pain.  Occasional diarrhea that seems to be diet related particularly when he has lots of coffee in the morning.  He has never been evaluated for the bleeding.  Tension headaches: these are chronic and intermittent.  Occasionally occur at the end of the day.  Typically occur in the frontal region.  No vision changes.  No sudden onset worst headache of life.  No excessive changes.  Active Ambulatory Problems    Diagnosis Date Noted  . Tension headache 07/27/2015  . Elevated LDL cholesterol level 10/29/2015  . Routine general medical examination at a health care facility 06/05/2017  . BRBPR (bright red blood per rectum) 06/06/2018   Resolved Ambulatory Problems    Diagnosis Date Noted  . Dizziness 07/27/2015  . URI (upper respiratory infection) 10/29/2015   Past Medical History:  Diagnosis Date  . Chickenpox   . Headache   . Vertigo     Family History  Problem Relation Age of Onset  . Diabetes Unknown        Grandparent    Social History   Socioeconomic History  . Marital status: Married    Spouse name: Not on file  . Number of children: Not on file  . Years of education: Not on file  . Highest education level: Not on file  Occupational History    . Not on file  Social Needs  . Financial resource strain: Not on file  . Food insecurity:    Worry: Not on file    Inability: Not on file  . Transportation needs:    Medical: Not on file    Non-medical: Not on file  Tobacco Use  . Smoking status: Former Research scientist (life sciences)  . Smokeless tobacco: Never Used  Substance and Sexual Activity  . Alcohol use: Yes    Alcohol/week: 1.0 standard drinks    Types: 1 Standard drinks or equivalent per week  . Drug use: No  . Sexual activity: Not on file  Lifestyle  . Physical activity:    Days per week: Not on file    Minutes per session: Not on file  . Stress: Not on file  Relationships  . Social connections:    Talks on phone: Not on file    Gets together: Not on file    Attends religious service: Not on file    Active member of club or organization: Not on file    Attends meetings of clubs or organizations: Not on file    Relationship status: Not on file  . Intimate partner violence:    Fear of current or ex partner: Not on file    Emotionally abused: Not on file    Physically  abused: Not on file    Forced sexual activity: Not on file  Other Topics Concern  . Not on file  Social History Narrative  . Not on file    ROS  General:  Negative for nexplained weight loss, fever Skin: Negative for new or changing mole, sore that won't heal HEENT: Negative for trouble hearing, trouble seeing, ringing in ears, mouth sores, hoarseness, change in voice, dysphagia. CV:  Negative for chest pain, dyspnea, edema, palpitations Resp: Negative for cough, dyspnea, hemoptysis GI: Positive for hematochezia, diarrhea, negative for nausea, vomiting, constipation, abdominal pain, melena. GU: Negative for dysuria, incontinence, urinary hesitance, hematuria, vaginal or penile discharge, polyuria, sexual difficulty, lumps in testicle or breasts MSK: Negative for muscle cramps or aches, joint pain or swelling Neuro: Positive for headaches, negative for weakness,  numbness, dizziness, passing out/fainting Psych: Negative for depression, anxiety, memory problems  Objective  Physical Exam Vitals:   06/06/18 0804  BP: 92/60  Pulse: 76  Temp: 98.4 F (36.9 C)  SpO2: 98%    BP Readings from Last 3 Encounters:  06/06/18 92/60  06/05/17 112/78  06/02/16 110/76   Wt Readings from Last 3 Encounters:  06/06/18 167 lb 6.4 oz (75.9 kg)  06/05/17 160 lb 12.8 oz (72.9 kg)  06/02/16 158 lb 6.4 oz (71.8 kg)    Physical Exam  Constitutional: No distress.  HENT:  Head: Normocephalic and atraumatic.  Mouth/Throat: Oropharynx is clear and moist.  Eyes: Pupils are equal, round, and reactive to light. Conjunctivae are normal.  Neck: Neck supple.  Cardiovascular: Normal rate, regular rhythm and normal heart sounds.  Pulmonary/Chest: Effort normal and breath sounds normal.  Abdominal: Soft. Bowel sounds are normal. He exhibits no distension. There is no tenderness. There is no rebound and no guarding.  Musculoskeletal: He exhibits no edema.  Lymphadenopathy:    He has no cervical adenopathy.  Neurological: He is alert.  Skin: Skin is warm and dry. He is not diaphoretic.  Psychiatric: He has a normal mood and affect.  External rectal exam completed with apparent external hemorrhoids, internal exam deferred to GI   Assessment/Plan:   Routine general medical examination at a health care facility Physical exam completed.  Encouraged adding exercise in several days a week to start.  He will monitor his diet.  Tetanus vaccination and flu vaccination given today.  Lab work as outlined below.  Tension headache Chronic issue.  Unchanged.  He will monitor.  BRBPR (bright red blood per rectum) We will refer to GI.  He appears to have possible external hemorrhoids.  We will check a CBC.   Orders Placed This Encounter  Procedures  . Lipid panel  . Comp Met (CMET)  . HgB A1c  . CBC  . Ambulatory referral to Gastroenterology    Referral Priority:    Routine    Referral Type:   Consultation    Referral Reason:   Specialty Services Required    Number of Visits Requested:   1    No orders of the defined types were placed in this encounter.    Tommi Rumps, MD [place]

## 2018-06-06 NOTE — Assessment & Plan Note (Signed)
Chronic issue.  Unchanged.  He will monitor.

## 2018-06-06 NOTE — Patient Instructions (Addendum)
Nice to see you. Please try to add in several days of exercise.  This could be as simple as walking. We will get lab work today and contact you with the results. Will get you to see GI for your rectal bleeding.

## 2018-07-10 ENCOUNTER — Encounter: Payer: Self-pay | Admitting: Gastroenterology

## 2018-07-10 ENCOUNTER — Ambulatory Visit (INDEPENDENT_AMBULATORY_CARE_PROVIDER_SITE_OTHER): Payer: BLUE CROSS/BLUE SHIELD | Admitting: Gastroenterology

## 2018-07-10 ENCOUNTER — Other Ambulatory Visit: Payer: Self-pay

## 2018-07-10 VITALS — BP 114/75 | HR 60 | Resp 16 | Ht 67.5 in | Wt 166.2 lb

## 2018-07-10 DIAGNOSIS — K625 Hemorrhage of anus and rectum: Secondary | ICD-10-CM

## 2018-07-10 NOTE — Progress Notes (Signed)
Arlyss Repressohini R Vanga, MD [address]  [address]  [place], [place] [address]  Main: [phone]  Fax: [phone]    Gastroenterology Consultation  Referring Provider:     Glori LuisSonnenberg, Diontay G, MD Primary Care Physician:  Glori LuisSonnenberg, Dawayne G, MD Primary Gastroenterologist:  Dr. Arlyss Repressohini R Vanga Reason for Consultation:     Rectal bleeding        HPI:   Roe Rutherfordric Blake is a 42 y.o. male referred by Dr. Birdie SonsSonnenberg, Yehuda MaoEric G, MD  for consultation & management of chronic intermittent painless rectal bleeding.  Patient is otherwise healthy.  He denies any GI symptoms other than rectal bleeding.  He reports that rectal bleeding is intermittent, on wiping and sometimes dripping into the toilet, he has been experiencing this as long as he can remember.  He reports occasional perianal itching or irritation.  Denies any rectal pain or discomfort.  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 His most recent CBC was normal, normal LFTs  NSAIDs: None  Antiplts/Anticoagulants/Anti thrombotics: None  GI Procedures: None He denies any GI surgeries He denies family history of GI malignancy  Past Medical History:  Diagnosis Date  . Chickenpox   . Headache   . Vertigo     No past surgical history on file.  Current Outpatient Medications:  .  ibuprofen (ADVIL,MOTRIN) 200 MG tablet, Take by mouth., Disp: , Rfl:  .  meclizine (ANTIVERT) 25 MG tablet, Take by mouth., Disp: , Rfl:    Family History  Problem Relation Age of Onset  . Diabetes Unknown        Grandparent     Social History   Tobacco Use  . Smoking status: Former Games developermoker  . Smokeless tobacco: Never Used  Substance Use Topics  . Alcohol use: Yes    Alcohol/week: 1.0 standard drinks    Types: 1 Standard drinks or equivalent per week  . Drug use: No    Allergies as of 07/10/2018  . (No Known Allergies)    Review of Systems:    All systems reviewed and negative except where noted in  HPI.   Physical Exam:  BP 114/75 (BP Location: Left Arm, Patient Position: Sitting, Cuff Size: Normal)   Pulse 60   Resp 16   Ht 5' 7.5" (1.715 m)   Wt 166 lb 3.2 oz (75.4 kg)   BMI 25.65 kg/m  No LMP for male patient.  General:   Alert,  Well-developed, well-nourished, pleasant and cooperative in NAD Head:  Normocephalic and atraumatic. Eyes:  Sclera clear, no icterus.   Conjunctiva pink. Ears:  Normal auditory acuity. Nose:  No deformity, discharge, or lesions. Mouth:  No deformity or lesions,oropharynx pink & moist. Neck:  Supple; no masses or thyromegaly. Lungs:  Respirations even and unlabored.  Clear throughout to auscultation.   No wheezes, crackles, or rhonchi. No acute distress. Heart:  Regular rate and rhythm; no murmurs, clicks, rubs, or gallops. Abdomen:  Normal bowel sounds. Soft, non-tender and non-distended without masses, hepatosplenomegaly or hernias noted.  No guarding or rebound tenderness.   Rectal: Not performed Msk:  Symmetrical without gross deformities. Good, equal movement & strength bilaterally. Pulses:  Normal pulses noted. Extremities:  No clubbing or edema.  No cyanosis. Neurologic:  Alert and oriented x3;  grossly normal neurologically. Skin:  Intact without significant lesions or rashes. No jaundice. Psych:  Alert and cooperative. Normal mood and affect.  Imaging Studies: None  Assessment  and Plan:   Chijioke Lasser is a 42 y.o. Caucasian male with chronic, painless, intermittent rectal bleeding.  Most likely secondary to internal hemorrhoids.  However, given his age, I recommend diagnostic colonoscopy to evaluate for colorectal malignancy or large polyps.  I also discussed with him about outpatient hemorrhoid ligation, information provided to him and he would like to proceed with this after colonoscopy  I have discussed alternative options, risks & benefits,  which include, but are not limited to, bleeding, infection, perforation,respiratory complication  & drug reaction.  The patient agrees with this plan & written consent will be obtained.      Follow up in 2 months   Arlyss Repress, MD

## 2018-08-06 ENCOUNTER — Ambulatory Visit
Admission: RE | Admit: 2018-08-06 | Discharge: 2018-08-06 | Disposition: A | Discharge status: Home / Self Care | Payer: BLUE CROSS/BLUE SHIELD | Attending: Gastroenterology | Admitting: Gastroenterology

## 2018-08-06 ENCOUNTER — Encounter
Admission: RE | Disposition: A | Discharge status: Home / Self Care | Payer: Self-pay | Source: Home / Self Care | Attending: Gastroenterology

## 2018-08-06 ENCOUNTER — Ambulatory Visit: Payer: BLUE CROSS/BLUE SHIELD | Admitting: Anesthesiology

## 2018-08-06 DIAGNOSIS — K625 Hemorrhage of anus and rectum: Secondary | ICD-10-CM | POA: Diagnosis not present

## 2018-08-06 DIAGNOSIS — K6289 Other specified diseases of anus and rectum: Secondary | ICD-10-CM | POA: Insufficient documentation

## 2018-08-06 DIAGNOSIS — Z791 Long term (current) use of non-steroidal anti-inflammatories (NSAID): Secondary | ICD-10-CM | POA: Insufficient documentation

## 2018-08-06 DIAGNOSIS — K644 Residual hemorrhoidal skin tags: Secondary | ICD-10-CM | POA: Insufficient documentation

## 2018-08-06 DIAGNOSIS — K648 Other hemorrhoids: Secondary | ICD-10-CM | POA: Insufficient documentation

## 2018-08-06 DIAGNOSIS — Z87891 Personal history of nicotine dependence: Secondary | ICD-10-CM | POA: Insufficient documentation

## 2018-08-06 HISTORY — PX: COLONOSCOPY WITH PROPOFOL: SHX5780

## 2018-08-06 SURGERY — COLONOSCOPY WITH PROPOFOL
Anesthesia: General

## 2018-08-06 MED ORDER — PROPOFOL 10 MG/ML IV BOLUS
INTRAVENOUS | Status: DC | PRN
  Administered 2018-08-06: 60 mg via INTRAVENOUS
  Administered 2018-08-06 (×2): 20 mg via INTRAVENOUS

## 2018-08-06 MED ORDER — PROPOFOL 500 MG/50ML IV EMUL
INTRAVENOUS | Status: AC
  Filled 2018-08-06: qty 50

## 2018-08-06 MED ORDER — PROPOFOL 500 MG/50ML IV EMUL
INTRAVENOUS | Status: DC | PRN
  Administered 2018-08-06: 175 ug/kg/min via INTRAVENOUS

## 2018-08-06 MED ORDER — SODIUM CHLORIDE 0.9 % IV SOLN
INTRAVENOUS | Status: DC
  Administered 2018-08-06: 10:00:00 via INTRAVENOUS

## 2018-08-06 MED ORDER — LIDOCAINE HCL (PF) 2 % IJ SOLN
INTRAMUSCULAR | Status: AC
  Filled 2018-08-06: qty 10

## 2018-08-06 MED ORDER — LIDOCAINE HCL (CARDIAC) PF 100 MG/5ML IV SOSY
PREFILLED_SYRINGE | INTRAVENOUS | Status: DC | PRN
  Administered 2018-08-06: 50 mg via INTRAVENOUS

## 2018-08-06 NOTE — H&P (Signed)
Arlyss Repress, MD [address]  [address]  [place], [place] [address]  Main: [phone]  Fax: [phone] Pager: [phone]  Primary Care Physician:  Glori Luis, MD Primary Gastroenterologist:  Dr. Arlyss Repress  Pre-Procedure History & Physical: HPI:  Tyler Salinas is a 42 y.o. male is here for an colonoscopy.   Past Medical History:  Diagnosis Date  . Chickenpox   . Headache   . Vertigo     History reviewed. No pertinent surgical history.  Prior to Admission medications   Medication Sig Start Date End Date Taking? Authorizing Provider  ibuprofen (ADVIL,MOTRIN) 200 MG tablet Take by mouth.   Yes [provider]  meclizine (ANTIVERT) 25 MG tablet Take by mouth. 06/24/15   [provider]    Allergies as of 07/10/2018  . (No Known Allergies)    Family History  Problem Relation Age of Onset  . Diabetes Other        Grandparent    Social History   Socioeconomic History  . Marital status: Married    Spouse name: Not on file  . Number of children: Not on file  . Years of education: Not on file  . Highest education level: Not on file  Occupational History  . Not on file  Social Needs  . Financial resource strain: Not on file  . Food insecurity:    Worry: Not on file    Inability: Not on file  . Transportation needs:    Medical: Not on file    Non-medical: Not on file  Tobacco Use  . Smoking status: Former Games developer  . Smokeless tobacco: Never Used  Substance and Sexual Activity  . Alcohol use: Yes    Alcohol/week: 1.0 standard drinks    Types: 1 Standard drinks or equivalent per week  . Drug use: No  . Sexual activity: Not on file  Lifestyle  . Physical activity:    Days per week: Not on file    Minutes per session: Not on file  . Stress: Not on file  Relationships  . Social connections:    Talks on phone: Not on file    Gets together: Not on file    Attends religious service: Not on file    Active member of club  or organization: Not on file    Attends meetings of clubs or organizations: Not on file    Relationship status: Not on file  . Intimate partner violence:    Fear of current or ex partner: Not on file    Emotionally abused: Not on file    Physically abused: Not on file    Forced sexual activity: Not on file  Other Topics Concern  . Not on file  Social History Narrative  . Not on file    Review of Systems: See HPI, otherwise negative ROS  Physical Exam: BP 113/67   Pulse 68   Temp (!) 96.6 F (35.9 C) (Oral)   Resp 16   Ht 5\' 7"  (1.702 m)   Wt 72.6 kg   SpO2 100%   BMI 25.06 kg/m  General:   Alert,  pleasant and cooperative in NAD Head:  Normocephalic and atraumatic. Neck:  Supple; no masses or thyromegaly. Lungs:  Clear throughout to auscultation.    Heart:  Regular rate and rhythm. Abdomen:  Soft, nontender and nondistended. Normal bowel sounds, without guarding, and without rebound.   Neurologic:  Alert and  oriented x4;  grossly normal neurologically.  Impression/Plan: Tyler Salinas is here for an colonoscopy to be performed for rectal bleeding  Risks, benefits, limitations, and alternatives regarding  colonoscopy have been reviewed with the patient.  Questions have been answered.  All parties agreeable.   Lannette Donathohini Yovanny Coats, MD  08/06/2018, 10:10 AM

## 2018-08-06 NOTE — Anesthesia Postprocedure Evaluation (Signed)
Anesthesia Post Note  Patient: Tyler Salinas  Procedure(s) Performed: COLONOSCOPY WITH PROPOFOL (N/A )  Patient location during evaluation: PACU Anesthesia Type: General Level of consciousness: awake and alert Pain management: pain level controlled Vital Signs Assessment: post-procedure vital signs reviewed and stable Respiratory status: spontaneous breathing, nonlabored ventilation, respiratory function stable and patient connected to nasal cannula oxygen Cardiovascular status: blood pressure returned to baseline and stable Postop Assessment: no apparent nausea or vomiting Anesthetic complications: no     Last Vitals:  Vitals:   08/06/18 1105 08/06/18 1115  BP: 111/75 116/67  Pulse: 70 73  Resp: 13 20  Temp:    SpO2: 100% 100%    Last Pain:  Vitals:   08/06/18 1115  TempSrc:   PainSc: 0-No pain                 Jovita GammaKathryn L Fitzgerald

## 2018-08-06 NOTE — Op Note (Signed)
[facility] Gastroenterology Patient Name: Tyler Salinas Procedure Date: 08/06/2018 10:17 AM MRN: [id number] Account #: [id number] Date of Birth: 04-16-1976 Admit Type: Outpatient Age: 42 Room: [facility] ENDO ROOM 2 Gender: Male Note Status: Finalized Procedure:            Colonoscopy Indications:          This is the patient's first colonoscopy, Rectal bleeding Providers:            Lin Landsman MD, MD Referring MD:         Angela Adam. Caryl Bis (Referring MD) Medicines:            Monitored Anesthesia Care Complications:        No immediate complications. Estimated blood loss: None. Procedure:            Pre-Anesthesia Assessment:                       - Prior to the procedure, a History and Physical was                        performed, and patient medications and allergies were                        reviewed. The patient is competent. The risks and                        benefits of the procedure and the sedation options and                        risks were discussed with the patient. All questions                        were answered and informed consent was obtained.                        Patient identification and proposed procedure were                        verified by the physician, the nurse, the                        anesthesiologist, the anesthetist and the technician in                        the pre-procedure area in the procedure room in the                        endoscopy suite. Mental Status Examination: alert and                        oriented. Airway Examination: normal oropharyngeal                        airway and neck mobility. Respiratory Examination:                        clear to auscultation. CV Examination: normal.                        Prophylactic Antibiotics: The patient does not require  prophylactic antibiotics. Prior Anticoagulants: The                        patient has taken no previous anticoagulant or                         antiplatelet agents. ASA Grade Assessment: I - A                        normal, healthy patient. After reviewing the risks and                        benefits, the patient was deemed in satisfactory                        condition to undergo the procedure. The anesthesia plan                        was to use monitored anesthesia care (MAC). Immediately                        prior to administration of medications, the patient was                        re-assessed for adequacy to receive sedatives. The                        heart rate, respiratory rate, oxygen saturations, blood                        pressure, adequacy of pulmonary ventilation, and                        response to care were monitored throughout the                        procedure. The physical status of the patient was                        re-assessed after the procedure.                       After obtaining informed consent, the colonoscope was                        passed under direct vision. Throughout the procedure,                        the patient's blood pressure, pulse, and oxygen                        saturations were monitored continuously. The                        Colonoscope was introduced through the anus and                        advanced to the the terminal ileum, with identification                        of  the appendiceal orifice and IC valve. The                        colonoscopy was performed without difficulty. The                        patient tolerated the procedure well. The quality of                        the bowel preparation was good. Findings:      The perianal and digital rectal examinations were normal. Pertinent       negatives include normal sphincter tone and no palpable rectal lesions.      The terminal ileum appeared normal.      A scattered area of mildly erythematous mucosa was found in the distal       rectum. Biopsies were taken with a cold  forceps for histology.      Normal mucosa was found in the entire colon.      Non-bleeding external and internal hemorrhoids were found during       retroflexion. The hemorrhoids were medium-sized.      Skin tags were found on perianal exam.      The exam was otherwise without abnormality. Impression:           - The examined portion of the ileum was normal.                       - Erythematous mucosa in the distal rectum. Biopsied.                       - Normal mucosa in the entire examined colon.                       - Non-bleeding external and internal hemorrhoids.                       - Perianal skin tags found on perianal exam.                       - The examination was otherwise normal. Recommendation:       - Discharge patient to home (with spouse).                       - Resume regular diet today.                       - Continue present medications.                       - Await pathology results.                       - Repeat colonoscopy in 10 years for screening purposes.                       - Return to my office as previously scheduled. Procedure Code(s):    --- Professional ---                       [phone], Colonoscopy, flexible; with biopsy, single or  multiple Diagnosis Code(s):    --- Professional ---                       K62.89, Other specified diseases of anus and rectum                       K64.8, Other hemorrhoids                       K64.4, Residual hemorrhoidal skin tags                       K62.5, Hemorrhage of anus and rectum CPT copyright 2018 American Medical Association. All rights reserved. The codes documented in this report are preliminary and upon coder review may  be revised to meet current compliance requirements. Dr. Ulyess Mort Lin Landsman MD, MD 08/06/2018 10:39:07 AM This report has been signed electronically. Number of Addenda: 0 Note Initiated On: 08/06/2018 10:17 AM Scope Withdrawal Time: 0 hours 9  minutes 51 seconds  Total Procedure Duration: 0 hours 12 minutes 11 seconds       [facility]

## 2018-08-06 NOTE — Anesthesia Procedure Notes (Signed)
Date/Time: 08/06/2018 10:20 AM Performed by: Ginger CarneMichelet, Tsering Leaman, CRNA Pre-anesthesia Checklist: Patient identified, Emergency Drugs available, Suction available, Patient being monitored and Timeout performed Patient Re-evaluated:Patient Re-evaluated prior to induction Oxygen Delivery Method: Nasal cannula Preoxygenation: Pre-oxygenation with 100% oxygen Induction Type: IV induction

## 2018-08-06 NOTE — Anesthesia Preprocedure Evaluation (Addendum)
Anesthesia Evaluation  Patient identified by MRN, date of birth, ID band Patient awake    Reviewed: Allergy & Precautions, H&P , NPO status , Patient's Chart, lab work & pertinent test results  Airway Mallampati: III       Dental  (+) Teeth Intact   Pulmonary neg COPD, former smoker,           Cardiovascular (-) angina(-) Past MI and (-) Cardiac Stents negative cardio ROS       Neuro/Psych  Headaches, negative psych ROS   GI/Hepatic negative GI ROS, Neg liver ROS,   Endo/Other  negative endocrine ROS  Renal/GU negative Renal ROS  negative genitourinary   Musculoskeletal   Abdominal   Peds  Hematology negative hematology ROS (+)   Anesthesia Other Findings Past Medical History: No date: Chickenpox No date: Headache No date: Vertigo  History reviewed. No pertinent surgical history.  BMI    Body Mass Index:  25.06 kg/m      Reproductive/Obstetrics negative OB ROS                           Anesthesia Physical Anesthesia Plan  ASA: I  Anesthesia Plan: General   Post-op Pain Management:    Induction:   PONV Risk Score and Plan: Propofol infusion and TIVA  Airway Management Planned: Natural Airway and Nasal Cannula  Additional Equipment:   Intra-op Plan:   Post-operative Plan:   Informed Consent: I have reviewed the patients History and Physical, chart, labs and discussed the procedure including the risks, benefits and alternatives for the proposed anesthesia with the patient or authorized representative who has indicated his/her understanding and acceptance.   Dental Advisory Given  Plan Discussed with: Anesthesiologist, CRNA and Surgeon  Anesthesia Plan Comments:         Anesthesia Quick Evaluation

## 2018-08-06 NOTE — Transfer of Care (Signed)
Immediate Anesthesia Transfer of Care Note  Patient: Tyler Salinas  Procedure(s) Performed: COLONOSCOPY WITH PROPOFOL (N/A )  Patient Location: PACU  Anesthesia Type:General  Level of Consciousness: awake, alert  and oriented  Airway & Oxygen Therapy: Patient Spontanous Breathing and Patient connected to nasal cannula oxygen  Post-op Assessment: Report given to RN and Post -op Vital signs reviewed and stable  Post vital signs: Reviewed and stable  Last Vitals:  Vitals Value Taken Time  BP 90/51 08/06/2018 10:55 AM  Temp 35.6 C 08/06/2018 10:55 AM  Pulse 73 08/06/2018 10:56 AM  Resp 15 08/06/2018 10:56 AM  SpO2 100 % 08/06/2018 10:56 AM  Vitals shown include unvalidated device data.  Last Pain:  Vitals:   08/06/18 1055  TempSrc: Tympanic  PainSc: 0-No pain         Complications: No apparent anesthesia complications

## 2018-08-06 NOTE — Anesthesia Post-op Follow-up Note (Signed)
Anesthesia QCDR form completed.        

## 2018-08-08 LAB — SURGICAL PATHOLOGY

## 2018-09-11 ENCOUNTER — Ambulatory Visit: Payer: BLUE CROSS/BLUE SHIELD | Admitting: Gastroenterology

## 2018-09-11 ENCOUNTER — Encounter: Payer: Self-pay | Admitting: Gastroenterology

## 2018-09-11 VITALS — BP 122/76 | HR 65 | Resp 16 | Ht 67.5 in | Wt 169.0 lb

## 2018-09-11 DIAGNOSIS — K641 Second degree hemorrhoids: Secondary | ICD-10-CM | POA: Diagnosis not present

## 2018-09-11 NOTE — Progress Notes (Signed)
Arlyss Repressohini R Imraan Wendell, MD [address]  [address]  [place], [place] [address]  Main: [phone]  Fax: [phone]    Gastroenterology Consultation  Referring Provider:     Glori LuisSonnenberg, Kaeden G, MD Primary Care Physician:  Glori LuisSonnenberg, Kjell G, MD Primary Gastroenterologist:  Dr. Arlyss Repressohini R Ketty Bitton Reason for Consultation:     Rectal bleeding, grade 2 hemorrhoids        HPI:   Tyler Salinas is a 43 y.o. male referred by Dr. Birdie SonsSonnenberg, Yehuda MaoEric G, MD  for consultation & management of chronic intermittent painless rectal bleeding.  Patient is otherwise healthy.  He denies any GI symptoms other than rectal bleeding.  He reports that rectal bleeding is intermittent, on wiping and sometimes dripping into the toilet, he has been experiencing this as long as he can remember.  He reports occasional perianal itching or irritation.  Denies any rectal pain or discomfort.  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 His most recent CBC was normal, normal LFTs  Follow-up visit 09/11/2018 Patient underwent colonoscopy which which was unremarkable other than hemorrhoids.  He is here to discuss about hemorrhoid ligation as his symptoms are persistent.  He has itching, bleeding as well as prolapse  NSAIDs: None  Antiplts/Anticoagulants/Anti thrombotics: None  GI Procedures:  Colonoscopy 08/06/2018 - The examined portion of the ileum was normal. - Erythematous mucosa in the distal rectum. Biopsied. - Normal mucosa in the entire examined colon. - Non-bleeding external and internal hemorrhoids. - Perianal skin tags found on perianal exam. - The examination was otherwise normal.  DIAGNOSIS:  A. RECTUM; COLD BIOPSY:  - COLONIC MUCOSA WITH FOCAL AND MINIMAL ACTIVE PROCTITIS, FAVOR BOWEL  PREP EFFECT.  - NEGATIVE FOR FEATURES OF CHRONICITY, VIRAL CYTOPATHIC EFFECT,  DYSPLASIA, AND MALIGNANCY.    He denies any GI surgeries He denies family history of GI  malignancy  Past Medical History:  Diagnosis Date  . Chickenpox   . Headache   . Vertigo     Past Surgical History:  Procedure Laterality Date  . COLONOSCOPY WITH PROPOFOL N/A 08/06/2018   Procedure: COLONOSCOPY WITH PROPOFOL;  Surgeon: Toney ReilVanga, Katianne Barre Reddy, MD;  [facility] END[facility]COPY;  Service: Gastroenterology;  Laterality: N/A;    Current Outpatient Medications:  .  ibuprofen (ADVIL,MOTRIN) 200 MG tablet, Take by mouth., Disp: , Rfl:  .  meclizine (ANTIVERT) 25 MG tablet, Take by mouth., Disp: , Rfl:    Family History  Problem Relation Age of Onset  . Diabetes Other        Grandparent     Social History   Tobacco Use  . Smoking status: Former Games developermoker  . Smokeless tobacco: Never Used  Substance Use Topics  . Alcohol use: Yes    Alcohol/week: 1.0 standard drinks    Types: 1 Standard drinks or equivalent per week  . Drug use: No    Allergies as of 09/11/2018  . (No Known Allergies)    Review of Systems:    All systems reviewed and negative except where noted in HPI.   Physical Exam:  BP 122/76 (BP Location: Left Arm, Patient Position: Sitting, Cuff Size: Large)   Pulse 65   Resp 16   Ht 5' 7.5" (1.715 m)   Wt 169 lb (76.7 kg)   BMI 26.08 kg/m  No LMP for male patient.  General:   Alert,  Well-developed, well-nourished, pleasant and cooperative in NAD Head:  Normocephalic and atraumatic. Eyes:  Sclera clear, no icterus.   Conjunctiva pink. Ears:  Normal auditory acuity. Nose:  No deformity, discharge, or lesions. Mouth:  No deformity or lesions,oropharynx pink & moist. Neck:  Supple; no masses or thyromegaly. Lungs:  Respirations even and unlabored.  Clear throughout to auscultation.   No wheezes, crackles, or rhonchi. No acute distress. Heart:  Regular rate and rhythm; no murmurs, clicks, rubs, or gallops. Abdomen:  Normal bowel sounds. Soft, non-tender and non-distended without masses, hepatosplenomegaly or hernias noted.  No guarding or rebound  tenderness.   Rectal: Prolapsed right posterior external hemorrhoid, normal perianal exam Msk:  Symmetrical without gross deformities. Good, equal movement & strength bilaterally. Pulses:  Normal pulses noted. Extremities:  No clubbing or edema.  No cyanosis. Neurologic:  Alert and oriented x3;  grossly normal neurologically. Skin:  Intact without significant lesions or rashes. No jaundice. Psych:  Alert and cooperative. Normal mood and affect.  Imaging Studies: None  Assessment and Plan:   Tyler Salinas is a 43 y.o. Caucasian male for follow-up of chronic grade 2 hemorrhoids Colonoscopy unremarkable Discussed risks and benefits of hemorrhoid ligation, patient agreeable Consent obtained Perform hemorrhoid ligation today   Follow up in 2 weeks   Arlyss Repress, MD

## 2018-09-11 NOTE — Progress Notes (Signed)

## 2018-09-17 DIAGNOSIS — J1189 Influenza due to unidentified influenza virus with other manifestations: Secondary | ICD-10-CM | POA: Diagnosis not present

## 2018-09-26 ENCOUNTER — Encounter: Payer: Self-pay | Admitting: Gastroenterology

## 2018-09-26 ENCOUNTER — Ambulatory Visit: Payer: BLUE CROSS/BLUE SHIELD | Admitting: Gastroenterology

## 2018-09-26 VITALS — BP 121/83 | HR 86 | Resp 17 | Ht 67.5 in | Wt 165.4 lb

## 2018-09-26 DIAGNOSIS — K641 Second degree hemorrhoids: Secondary | ICD-10-CM | POA: Diagnosis not present

## 2018-09-26 NOTE — Progress Notes (Signed)

## 2018-10-09 ENCOUNTER — Ambulatory Visit: Payer: BLUE CROSS/BLUE SHIELD | Admitting: Gastroenterology

## 2018-10-10 ENCOUNTER — Ambulatory Visit: Payer: BLUE CROSS/BLUE SHIELD | Admitting: Gastroenterology

## 2018-10-10 ENCOUNTER — Encounter: Payer: Self-pay | Admitting: Gastroenterology

## 2018-10-10 ENCOUNTER — Other Ambulatory Visit: Payer: Self-pay

## 2018-10-10 VITALS — BP 124/75 | HR 71 | Resp 17 | Ht 67.5 in | Wt 164.2 lb

## 2018-10-10 DIAGNOSIS — K641 Second degree hemorrhoids: Secondary | ICD-10-CM | POA: Diagnosis not present

## 2018-10-10 NOTE — Progress Notes (Signed)
PROCEDURE NOTE: The patient presents with symptomatic grade 2 hemorrhoids, unresponsive to maximal medical therapy, requesting rubber band ligation of his/her hemorrhoidal disease.  All risks, benefits and alternative forms of therapy were described and informed consent was obtained.  The decision was made to band the LL internal hemorrhoid, and the [facility] O'Regan System was used to perform band ligation without complication.  Digital anorectal examination was then performed to assure proper positioning of the band, and to adjust the banded tissue as required.  The patient was discharged home without pain or other issues.  Dietary and behavioral recommendations were given and (if necessary - prescriptions were given), along with follow-up instructions.  The patient will return as needed for follow-up and possible additional banding as required.  No complications were encountered and the patient tolerated the procedure well.   

## 2018-10-12 DIAGNOSIS — R6889 Other general symptoms and signs: Secondary | ICD-10-CM | POA: Diagnosis not present

## 2018-10-12 DIAGNOSIS — J22 Unspecified acute lower respiratory infection: Secondary | ICD-10-CM | POA: Diagnosis not present

## 2018-10-12 DIAGNOSIS — R05 Cough: Secondary | ICD-10-CM | POA: Diagnosis not present

## 2019-06-11 ENCOUNTER — Other Ambulatory Visit: Payer: Self-pay

## 2019-06-12 ENCOUNTER — Encounter: Payer: Self-pay | Admitting: Family Medicine

## 2019-06-12 ENCOUNTER — Ambulatory Visit (INDEPENDENT_AMBULATORY_CARE_PROVIDER_SITE_OTHER): Payer: BC Managed Care – PPO | Admitting: Family Medicine

## 2019-06-12 ENCOUNTER — Other Ambulatory Visit: Payer: Self-pay

## 2019-06-12 VITALS — BP 100/60 | HR 77 | Temp 97.7°F | Ht 67.0 in | Wt 165.8 lb

## 2019-06-12 DIAGNOSIS — Z0001 Encounter for general adult medical examination with abnormal findings: Secondary | ICD-10-CM | POA: Diagnosis not present

## 2019-06-12 DIAGNOSIS — Z23 Encounter for immunization: Secondary | ICD-10-CM

## 2019-06-12 DIAGNOSIS — F419 Anxiety disorder, unspecified: Secondary | ICD-10-CM | POA: Insufficient documentation

## 2019-06-12 DIAGNOSIS — Z13220 Encounter for screening for lipoid disorders: Secondary | ICD-10-CM

## 2019-06-12 DIAGNOSIS — E663 Overweight: Secondary | ICD-10-CM | POA: Diagnosis not present

## 2019-06-12 DIAGNOSIS — R0681 Apnea, not elsewhere classified: Secondary | ICD-10-CM | POA: Diagnosis not present

## 2019-06-12 LAB — COMPREHENSIVE METABOLIC PANEL
ALT: 24 U/L (ref 0–53)
AST: 22 U/L (ref 0–37)
Albumin: 4.6 g/dL (ref 3.5–5.2)
Alkaline Phosphatase: 65 U/L (ref 39–117)
BUN: 19 mg/dL (ref 6–23)
CO2: 30 mEq/L (ref 19–32)
Calcium: 9.3 mg/dL (ref 8.4–10.5)
Chloride: 103 mEq/L (ref 96–112)
Creatinine, Ser: 1.14 mg/dL (ref 0.40–1.50)
GFR: 70.66 mL/min (ref >60.00)
Glucose, Bld: 90 mg/dL (ref 70–99)
Potassium: 4.4 mEq/L (ref 3.5–5.1)
Sodium: 138 mEq/L (ref 135–145)
Total Bilirubin: 0.5 mg/dL (ref 0.2–1.2)
Total Protein: 6.7 g/dL (ref 6.0–8.3)

## 2019-06-12 LAB — LIPID PANEL
Cholesterol: 200 mg/dL (ref 0–200)
HDL: 42.8 mg/dL (ref >39.00)
LDL Cholesterol: 137 mg/dL — ABNORMAL HIGH (ref 0–99)
NonHDL: 157.56
Total CHOL/HDL Ratio: 5
Triglycerides: 105 mg/dL (ref 0.0–149.0)
VLDL: 21 mg/dL (ref 0.0–40.0)

## 2019-06-12 LAB — HEMOGLOBIN A1C: Hgb A1c MFr Bld: 5.1 % (ref 4.6–6.5)

## 2019-06-12 NOTE — Assessment & Plan Note (Signed)
Home sleep study ordered.  If patient does not hear about this in the next 1 to 2 weeks he will contact us.

## 2019-06-12 NOTE — Progress Notes (Signed)
Tommi Rumps, MD Phone: [phone]  Tyler Salinas is a 43 y.o. male who presents today for CPE.  Exercise: Not exercising at this time as he does not have the time to do this. Diet: This is healthy.  They have been cooking lots at home.  Not eating out.  No soda or sweet tea.  Lots of fruits and vegetables. No family history of prostate or colon cancer. Tetanus vaccine and flu vaccine up-to-date. HIV screening up-to-date. No tobacco use or illicit drug use.  Occasional alcohol use. Sees a dentist and an ophthalmologist. The patient reports concern for sleep apnea.  His wife notes he snores and she has witnessed some apneic spells.  He does note some hypersomnia later in the day.  Occasionally he does not wake up well rested. Anxiety: Patient notes this has been worse since the pandemic started.  Some days are much worse than others and he will have crying spells.  He denies depression.  He is interested in treatment for this.  Active Ambulatory Problems    Diagnosis Date Noted  . Tension headache 07/27/2015  . Elevated LDL cholesterol level 10/29/2015  . Migraine variant 07/21/2015  . Grade II hemorrhoids 09/11/2018  . Encounter for general adult medical examination with abnormal findings 06/12/2019  . Anxiety 06/12/2019  . Witnessed apneic spells 06/12/2019   Resolved Ambulatory Problems    Diagnosis Date Noted  . Dizziness 07/27/2015  . URI (upper respiratory infection) 10/29/2015  . Rectal bleeding    Past Medical History:  Diagnosis Date  . Chickenpox   . Headache   . Vertigo     Family History  Problem Relation Age of Onset  . Diabetes Other        Grandparent    Social History   Socioeconomic History  . Marital status: Married    Spouse name: Not on file  . Number of children: Not on file  . Years of education: Not on file  . Highest education level: Not on file  Occupational History  . Not on file  Social Needs  . Financial resource strain: Not on  file  . Food insecurity    Worry: Not on file    Inability: Not on file  . Transportation needs    Medical: Not on file    Non-medical: Not on file  Tobacco Use  . Smoking status: Former Research scientist (life sciences)  . Smokeless tobacco: Never Used  Substance and Sexual Activity  . Alcohol use: Yes    Alcohol/week: 1.0 standard drinks    Types: 1 Standard drinks or equivalent per week  . Drug use: No  . Sexual activity: Not on file  Lifestyle  . Physical activity    Days per week: Not on file    Minutes per session: Not on file  . Stress: Not on file  Relationships  . Social Herbalist on phone: Not on file    Gets together: Not on file    Attends religious service: Not on file    Active member of club or organization: Not on file    Attends meetings of clubs or organizations: Not on file    Relationship status: Not on file  . Intimate partner violence    Fear of current or ex partner: Not on file    Emotionally abused: Not on file    Physically abused: Not on file    Forced sexual activity: Not on file  Other Topics Concern  . Not on  file  Social History Narrative  . Not on file    ROS  General:  Negative for nexplained weight loss, fever Skin: Negative for new or changing mole, sore that won't heal HEENT: Negative for trouble hearing, trouble seeing, ringing in ears, mouth sores, hoarseness, change in voice, dysphagia. CV:  Negative for chest pain, dyspnea, edema, palpitations Resp: Negative for cough, dyspnea, hemoptysis GI: Negative for nausea, vomiting, diarrhea, constipation, abdominal pain, melena, hematochezia. GU: Negative for dysuria, incontinence, urinary hesitance, hematuria, vaginal or penile discharge, polyuria, sexual difficulty, lumps in testicle or breasts MSK: Negative for muscle cramps or aches, joint pain or swelling Neuro: Negative for headaches, weakness, numbness, dizziness, passing out/fainting Psych: Positive for anxiety, negative for depression, memory  problems  Objective  Physical Exam Vitals:   06/12/19 0837  BP: 100/60  Pulse: 77  Temp: 97.7 F (36.5 C)  SpO2: 96%    BP Readings from Last 3 Encounters:  06/12/19 100/60  10/10/18 124/75  09/26/18 121/83   Wt Readings from Last 3 Encounters:  06/12/19 165 lb 12.8 oz (75.2 kg)  10/10/18 164 lb 3.2 oz (74.5 kg)  09/26/18 165 lb 6.4 oz (75 kg)    Physical Exam Constitutional:      General: He is not in acute distress.    Appearance: He is not diaphoretic.  HENT:     Head: Normocephalic.     Mouth/Throat:     Mouth: Mucous membranes are moist.     Pharynx: Oropharynx is clear.  Eyes:     Conjunctiva/sclera: Conjunctivae normal.     Pupils: Pupils are equal, round, and reactive to light.  Cardiovascular:     Rate and Rhythm: Normal rate and regular rhythm.     Heart sounds: Normal heart sounds.  Pulmonary:     Effort: Pulmonary effort is normal.     Breath sounds: Normal breath sounds.  Abdominal:     General: Bowel sounds are normal. There is no distension.     Palpations: Abdomen is soft.     Tenderness: There is no abdominal tenderness. There is no guarding or rebound.  Musculoskeletal:     Right lower leg: No edema.     Left lower leg: No edema.  Lymphadenopathy:     Cervical: No cervical adenopathy.  Skin:    General: Skin is warm and dry.  Neurological:     Mental Status: He is alert.  Psychiatric:     Comments: Mood anxious      Assessment/Plan:   Encounter for general adult medical examination with abnormal findings Physical exam completed.  Encouraged exercise.  Encouraged continued healthy diet.  Flu vaccine given.  Lab work as outlined below.  Anxiety Discussed options of medication versus therapy.  Patient opted for therapy to start with.  Witnessed apneic spells Home sleep study ordered.  If patient does not hear about this in the next 1 to 2 weeks he will contact us.   Orders Placed This Encounter  Procedures  . Flu Vaccine QUAD  36+ mos IM  . HgB A1c  . Comp Met (CMET)  . Lipid panel  . Ambulatory referral to Psychology    Referral Priority:   Routine    Referral Type:   Psychiatric    Referral Reason:   Specialty Services Required    Requested Specialty:   Psychology    Number of Visits Requested:   1  . Home sleep test    Order Specific Question:   Where should this  test be performed:    Answer:   [facility]    No orders of the defined types were placed in this encounter.    Tommi Rumps, MD [place]

## 2019-06-12 NOTE — Addendum Note (Signed)
Addended by: Leone Haven on: 06/12/2019 09:08 AM   Modules accepted: Orders

## 2019-06-12 NOTE — Patient Instructions (Signed)
Nice to see you. Please try to incorporate some exercise and 2 to 3 days a week.  This could be as simple as walking. Please continue with healthy diet. We will get you set up with a sleep study and an appointment with the therapist.  If you do not hear about these things in the next 1 to 2 weeks please let us know. We will contact you with your labs.

## 2019-06-12 NOTE — Assessment & Plan Note (Signed)
Physical exam completed.  Encouraged exercise.  Encouraged continued healthy diet.  Flu vaccine given.  Lab work as outlined below.

## 2019-06-12 NOTE — Assessment & Plan Note (Signed)
Discussed options of medication versus therapy.  Patient opted for therapy to start with.

## 2019-06-18 DIAGNOSIS — G4733 Obstructive sleep apnea (adult) (pediatric): Secondary | ICD-10-CM | POA: Diagnosis not present

## 2019-06-18 DIAGNOSIS — R0602 Shortness of breath: Secondary | ICD-10-CM | POA: Diagnosis not present

## 2019-06-18 LAB — PULMONARY FUNCTION TEST

## 2019-06-19 DIAGNOSIS — G4733 Obstructive sleep apnea (adult) (pediatric): Secondary | ICD-10-CM | POA: Diagnosis not present

## 2019-06-19 DIAGNOSIS — R0602 Shortness of breath: Secondary | ICD-10-CM | POA: Diagnosis not present

## 2019-06-28 ENCOUNTER — Telehealth: Payer: Self-pay | Admitting: Family Medicine

## 2019-06-28 NOTE — Telephone Encounter (Signed)
Please let the patient know that his home sleep study did not reveal sleep apnea.  There is no need for CPAP.  If he continues to have issues with excessive sleepiness or if his wife notices additional episodes of him stopping breathing at night he should let us know and we can have him see a pulmonologist.

## 2019-07-01 NOTE — Telephone Encounter (Signed)
I called and spoke with the patient and informed him of his sleep study results.  Patient voiced understanding.  Everleigh Colclasure,cma

## 2019-07-23 ENCOUNTER — Ambulatory Visit (INDEPENDENT_AMBULATORY_CARE_PROVIDER_SITE_OTHER): Payer: BC Managed Care – PPO | Admitting: Psychology

## 2019-07-23 DIAGNOSIS — F4323 Adjustment disorder with mixed anxiety and depressed mood: Secondary | ICD-10-CM | POA: Diagnosis not present

## 2019-08-08 ENCOUNTER — Ambulatory Visit (INDEPENDENT_AMBULATORY_CARE_PROVIDER_SITE_OTHER): Payer: BC Managed Care – PPO | Admitting: Psychology

## 2019-08-08 DIAGNOSIS — F4323 Adjustment disorder with mixed anxiety and depressed mood: Secondary | ICD-10-CM

## 2019-08-30 ENCOUNTER — Ambulatory Visit (INDEPENDENT_AMBULATORY_CARE_PROVIDER_SITE_OTHER): Payer: Self-pay | Admitting: Psychology

## 2019-08-30 DIAGNOSIS — F4323 Adjustment disorder with mixed anxiety and depressed mood: Secondary | ICD-10-CM

## 2019-09-03 ENCOUNTER — Ambulatory Visit: Payer: BLUE CROSS/BLUE SHIELD | Attending: Internal Medicine

## 2019-09-03 DIAGNOSIS — Z20822 Contact with and (suspected) exposure to covid-19: Secondary | ICD-10-CM | POA: Diagnosis not present

## 2019-09-05 LAB — NOVEL CORONAVIRUS, NAA: SARS-CoV-2, NAA: NOT DETECTED

## 2019-09-17 ENCOUNTER — Ambulatory Visit (INDEPENDENT_AMBULATORY_CARE_PROVIDER_SITE_OTHER): Payer: Self-pay | Admitting: Psychology

## 2019-09-17 DIAGNOSIS — F4323 Adjustment disorder with mixed anxiety and depressed mood: Secondary | ICD-10-CM

## 2019-10-05 ENCOUNTER — Ambulatory Visit: Payer: BC Managed Care – PPO | Admitting: Psychology

## 2019-10-12 ENCOUNTER — Ambulatory Visit (INDEPENDENT_AMBULATORY_CARE_PROVIDER_SITE_OTHER): Payer: BC Managed Care – PPO | Admitting: Psychology

## 2019-10-12 DIAGNOSIS — F4323 Adjustment disorder with mixed anxiety and depressed mood: Secondary | ICD-10-CM | POA: Diagnosis not present

## 2019-10-16 ENCOUNTER — Ambulatory Visit (INDEPENDENT_AMBULATORY_CARE_PROVIDER_SITE_OTHER): Payer: BC Managed Care – PPO | Admitting: Family Medicine

## 2019-10-16 ENCOUNTER — Other Ambulatory Visit: Payer: Self-pay

## 2019-10-16 ENCOUNTER — Encounter: Payer: Self-pay | Admitting: Family Medicine

## 2019-10-16 DIAGNOSIS — F419 Anxiety disorder, unspecified: Secondary | ICD-10-CM

## 2019-10-16 DIAGNOSIS — E7800 Pure hypercholesterolemia, unspecified: Secondary | ICD-10-CM

## 2019-10-16 NOTE — Assessment & Plan Note (Signed)
Does not meet criteria for treatment.  He will continue with healthy diet and exercise.

## 2019-10-16 NOTE — Progress Notes (Signed)
Virtual Visit via video Note  This visit type was conducted due to national recommendations for restrictions regarding the COVID-19 pandemic (e.g. social distancing).  This format is felt to be most appropriate for this patient at this time.  All issues noted in this document were discussed and addressed.  No physical exam was performed (except for noted visual exam findings with Video Visits).   I connected with Tyler Salinas today at  9:30 AM EST by a video enabled telemedicine application and verified that I am speaking with the correct person using two identifiers. Location patient: home Location provider: work Persons participating in the virtual visit: patient, provider  I discussed the limitations, risks, security and privacy concerns of performing an evaluation and management service by telephone and the availability of in person appointments. I also discussed with the patient that there may be a patient responsible charge related to this service. The patient expressed understanding and agreed to proceed.  Reason for visit: follow-up  HPI: Elevated LDL: Patient is eating much healthier since the start of the pandemic.  They are cooking at home and getting lots of fresh fruits and vegetables.  He is walking about a mile and a half some days out of the week for exercise.  No chest pain or shortness of breath.  Anxiety: He still continues to have some anxiety.  He has been seeing a therapist and doing video visits every 2 weeks.  They have been going through various exercises and some of them have been helpful.  He notes there is nothing too concerning with what is going on.  He denies depression.   ROS: See pertinent positives and negatives per HPI.  Past Medical History:  Diagnosis Date  . Chickenpox   . Headache   . Rectal bleeding   . Vertigo     Past Surgical History:  Procedure Laterality Date  . COLONOSCOPY WITH PROPOFOL N/A 08/06/2018   Procedure: COLONOSCOPY WITH  PROPOFOL;  Surgeon: Toney Reil, MD;  Location: [facility] ENDOSCOPY;  Service: Gastroenterology;  Laterality: N/A;    Family History  Problem Relation Age of Onset  . Diabetes Other        Grandparent    SOCIAL HX: Former smoker   Current Outpatient Medications:  .  ibuprofen (ADVIL,MOTRIN) 200 MG tablet, Take by mouth., Disp: , Rfl:  .  meclizine (ANTIVERT) 25 MG tablet, Take by mouth., Disp: , Rfl:   EXAM:  VITALS per patient if applicable:  GENERAL: alert, oriented, appears well and in no acute distress  HEENT: atraumatic, conjunttiva clear, no obvious abnormalities on inspection of external nose and ears  NECK: normal movements of the head and neck  LUNGS: on inspection no signs of respiratory distress, breathing rate appears normal, no obvious gross SOB, gasping or wheezing  CV: no obvious cyanosis  MS: moves all visible extremities without noticeable abnormality  PSYCH/NEURO: pleasant and cooperative, no obvious depression or anxiety, speech and thought processing grossly intact  ASSESSMENT AND PLAN:  Discussed the following assessment and plan:  Anxiety Continues to have some symptoms.  He is happy with proceeding with therapy at this time.  He defers any medication.  He will monitor and let us know if he would like to consider medication in the future.  Elevated LDL cholesterol level Does not meet criteria for treatment.  He will continue with healthy diet and exercise.   No orders of the defined types were placed in this encounter.   No orders of  the defined types were placed in this encounter.    I discussed the assessment and treatment plan with the patient. The patient was provided an opportunity to ask questions and all were answered. The patient agreed with the plan and demonstrated an understanding of the instructions.   The patient was advised to call back or seek an in-person evaluation if the symptoms worsen or if the condition fails to  improve as anticipated.   Tommi Rumps, MD

## 2019-10-16 NOTE — Assessment & Plan Note (Signed)
Continues to have some symptoms.  He is happy with proceeding with therapy at this time.  He defers any medication.  He will monitor and let us know if he would like to consider medication in the future.

## 2019-10-28 ENCOUNTER — Ambulatory Visit (INDEPENDENT_AMBULATORY_CARE_PROVIDER_SITE_OTHER): Payer: BC Managed Care – PPO | Admitting: Psychology

## 2019-10-28 DIAGNOSIS — F4323 Adjustment disorder with mixed anxiety and depressed mood: Secondary | ICD-10-CM | POA: Diagnosis not present

## 2019-11-07 ENCOUNTER — Ambulatory Visit: Payer: BC Managed Care – PPO | Attending: Internal Medicine

## 2019-11-07 DIAGNOSIS — Z20822 Contact with and (suspected) exposure to covid-19: Secondary | ICD-10-CM

## 2019-11-09 LAB — NOVEL CORONAVIRUS, NAA: SARS-CoV-2, NAA: NOT DETECTED

## 2019-11-26 ENCOUNTER — Ambulatory Visit: Payer: BC Managed Care – PPO | Admitting: Psychology

## 2019-12-17 ENCOUNTER — Ambulatory Visit (INDEPENDENT_AMBULATORY_CARE_PROVIDER_SITE_OTHER): Payer: BC Managed Care – PPO | Admitting: Psychology

## 2019-12-17 DIAGNOSIS — F4323 Adjustment disorder with mixed anxiety and depressed mood: Secondary | ICD-10-CM | POA: Diagnosis not present

## 2020-01-16 ENCOUNTER — Ambulatory Visit (INDEPENDENT_AMBULATORY_CARE_PROVIDER_SITE_OTHER): Payer: BC Managed Care – PPO | Admitting: Psychology

## 2020-01-16 DIAGNOSIS — F4323 Adjustment disorder with mixed anxiety and depressed mood: Secondary | ICD-10-CM | POA: Diagnosis not present

## 2020-03-12 ENCOUNTER — Ambulatory Visit (INDEPENDENT_AMBULATORY_CARE_PROVIDER_SITE_OTHER): Payer: BC Managed Care – PPO | Admitting: Psychology

## 2020-03-12 DIAGNOSIS — F4323 Adjustment disorder with mixed anxiety and depressed mood: Secondary | ICD-10-CM | POA: Diagnosis not present

## 2020-06-10 ENCOUNTER — Ambulatory Visit: Payer: BC Managed Care – PPO | Admitting: Psychology

## 2020-06-11 ENCOUNTER — Other Ambulatory Visit: Payer: Self-pay

## 2020-06-16 ENCOUNTER — Encounter: Payer: Self-pay | Admitting: Family Medicine

## 2020-06-16 ENCOUNTER — Other Ambulatory Visit: Payer: Self-pay

## 2020-06-16 ENCOUNTER — Ambulatory Visit (INDEPENDENT_AMBULATORY_CARE_PROVIDER_SITE_OTHER): Payer: BC Managed Care – PPO | Admitting: Family Medicine

## 2020-06-16 DIAGNOSIS — E7800 Pure hypercholesterolemia, unspecified: Secondary | ICD-10-CM

## 2020-06-16 DIAGNOSIS — Z3009 Encounter for other general counseling and advice on contraception: Secondary | ICD-10-CM | POA: Diagnosis not present

## 2020-06-16 DIAGNOSIS — F419 Anxiety disorder, unspecified: Secondary | ICD-10-CM | POA: Diagnosis not present

## 2020-06-16 MED ORDER — HYDROXYZINE HCL 10 MG PO TABS: 10.0000 mg | ORAL_TABLET | Freq: Three times a day (TID) | ORAL | 0 refills | Status: DC | PRN

## 2020-06-16 NOTE — Assessment & Plan Note (Signed)
Patient is not interested in daily medication.  Discussed the use of hydroxyzine on an as-needed basis.  Discussed that this could make him drowsy so he needs to be very careful and not drive if he is drowsy while taking this.  If it is not beneficial he will let us know.

## 2020-06-16 NOTE — Assessment & Plan Note (Signed)
Encouraged healthy diet.  Discussed adding in some exercise.

## 2020-06-16 NOTE — Progress Notes (Signed)
  Marikay Alar, MD Phone: [phone]  Tyler Salinas is a 44 y.o. male who presents today for f/u.  Anxiety: Patient notes he has been seeing a therapist and has made it about as far as he can with them.  There are some things he needs to work on with his anxiety and past trauma.  He notes 2-3 times in the past year he has had full-blown panic attacks.  Notes couple times a week he has an increase in anxiety.  He does not feel as though he has any significant depression.  Very busy schedule at work.  Also both of his children are back to in school learning and his wife has been traveling for work.  Elevated LDL: No chest pain.  They have been cooking for themselves over the last month or so they have been eating out due to busy schedules.  Not exercising much given his schedule.  Vasectomy: Patient notes he would like to have a vasectomy.  He is done having children.  His wife just had her IUD removed.  Social History   Tobacco Use  Smoking Status Former Smoker  Smokeless Tobacco Never Used     ROS see history of present illness  Objective  Physical Exam Vitals:   06/16/20 0824  BP: 118/70  Pulse: 84  Temp: 98.7 F (37.1 C)  SpO2: 97%    BP Readings from Last 3 Encounters:  06/16/20 118/70  06/12/19 100/60  10/10/18 124/75   Wt Readings from Last 3 Encounters:  06/16/20 175 lb 6.4 oz (79.6 kg)  10/16/19 160 lb (72.6 kg)  06/12/19 165 lb 12.8 oz (75.2 kg)    Physical Exam Constitutional:      General: He is not in acute distress.    Appearance: He is not diaphoretic.  Cardiovascular:     Rate and Rhythm: Normal rate and regular rhythm.     Heart sounds: Normal heart sounds.  Pulmonary:     Effort: Pulmonary effort is normal.     Breath sounds: Normal breath sounds.  Skin:    General: Skin is warm and dry.  Neurological:     Mental Status: He is alert.      Assessment/Plan: Please see individual problem list.  Problem List Items Addressed This Visit     Anxiety    Patient is not interested in daily medication.  Discussed the use of hydroxyzine on an as-needed basis.  Discussed that this could make him drowsy so he needs to be very careful and not drive if he is drowsy while taking this.  If it is not beneficial he will let us know.      Relevant Medications   hydrOXYzine (ATARAX/VISTARIL) 10 MG tablet   Elevated LDL cholesterol level    Encouraged healthy diet.  Discussed adding in some exercise.      Vasectomy evaluation    Refer to urology to consider vasectomy.      Relevant Orders   Ambulatory referral to Urology      This visit occurred during the SARS-CoV-2 public health emergency.  Safety protocols were in place, including screening questions prior to the visit, additional usage of staff PPE, and extensive cleaning of exam room while observing appropriate contact time as indicated for disinfecting solutions.    Marikay Alar, MD [facility]

## 2020-06-16 NOTE — Assessment & Plan Note (Signed)
Refer to urology to consider vasectomy.

## 2020-06-22 ENCOUNTER — Encounter: Payer: Self-pay | Admitting: Family Medicine

## 2020-06-24 MED ORDER — ESCITALOPRAM OXALATE 10 MG PO TABS: 10.0000 mg | ORAL_TABLET | Freq: Every day | ORAL | 3 refills | Status: DC

## 2020-06-24 NOTE — Progress Notes (Addendum)
06/25/2020 9:38 AM   Arlana Lindau [id number] [id number]  Referring provider: Glori Luis, MD [address] [place],  [place] [address]  Chief Complaint  Patient presents with  . VAS Consult    Tyler Salinas is a 44 y.o. male who presents for vasectomy counseling.  . Married with 2 children . Denies prior history urologic problems including chronic scrotal pain, epididymitis or orchitis . No previous history inguinal/pelvic hernia . No history of bleeding or clotting disorders   PMH: Past Medical History:  Diagnosis Date  . Chickenpox   . Headache   . Rectal bleeding   . Vertigo     Surgical History: Past Surgical History:  Procedure Laterality Date  . COLONOSCOPY WITH PROPOFOL N/A 08/06/2018   Procedure: COLONOSCOPY WITH PROPOFOL;  Surgeon: Toney Reil, MD;  Location: [facility] ENDOSCOPY;  Service: Gastroenterology;  Laterality: N/A;    Home Medications:  Allergies as of 06/25/2020   No Known Allergies     Medication List       Accurate as of June 25, 2020 11:59 PM. If you have any questions, ask your nurse or doctor.        escitalopram 10 MG tablet Commonly known as: Lexapro Take 1 tablet (10 mg total) by mouth daily.   ibuprofen 200 MG tablet Commonly known as: ADVIL Take by mouth.   meclizine 25 MG tablet Commonly known as: ANTIVERT Take by mouth.       Allergies: No Known Allergies  Family History: Family History  Problem Relation Age of Onset  . Diabetes Other        Grandparent    Social History:  reports that he has quit smoking. He has never used smokeless tobacco. He reports current alcohol use of about 1.0 standard drink of alcohol per week. He reports that he does not use drugs.   Physical Exam: There were no vitals taken for this visit.  Constitutional:  Alert and oriented, No acute distress. HEENT: [place] AT, moist mucus membranes.  Trachea midline, no masses. Cardiovascular: No clubbing, cyanosis, or  edema. Respiratory: Normal respiratory effort, no increased work of breathing. GI: Abdomen is soft, nontender, nondistended, no abdominal masses GU: Phallus without lesions, testes descended bilaterally without masses or tenderness, spermatic cord/epididymis palpably normal bilaterally.  Vasa palpable bilaterally Skin: No rashes, bruises or suspicious lesions. Neurologic: Grossly intact, no focal deficits, moving all 4 extremities. Psychiatric: Normal mood and affect.   Assessment & Plan:    1.  Undesired fertility . Desires to schedule vasectomy . We had a long discussion about vasectomy. We specifically discussed the procedure, recovery and the risks, benefits and alternatives of vasectomy. I explained that the procedure entails removal of a segment of each vas deferens, each of which conducts sperm, and that the purpose of this procedure is to cause sterility (inability to produce children or cause pregnancy). Vasectomy is intended to be permanent and irreversible form of contraception. Options for fertility after vasectomy include vasectomy reversal, or sperm retrieval with in vitro fertilization. These options are not always successful, and they may be expensive. We discussed reversible forms of birth control such as condoms, IUD or diaphragms, as well as the option of freezing sperm in a sperm bank prior to the vasectomy procedure. We discussed the importance of avoiding strenuous exercise for four days after vasectomy, and the importance of refraining from any form of ejaculation for seven days after vasectomy. I explained that vasectomy does not produce immediate  sterility so another form of contraceptive must be used until sterility is assured by having semen checked for sperm. Thus, a post vasectomy semen analysis is necessary to confirm sterility. Rarely, vasectomy must be repeated. We discussed the approximately 1 in 2,000 risk of pregnancy after vasectomy for men who have post-vasectomy  semen analysis showing absent sperm or rare non-motile sperm. Typical side effects include a small amount of oozing blood, some discomfort and mild swelling in the area of incision.  Vasectomy does not affect sexual performance, function, please, sensation, interest, desire, satisfaction, penile erection, volume of semen or ejaculation. Other rare risks include allergy or adverse reaction to an anesthetic, testicular atrophy, hematoma, infection/abscess, prolonged tenderness of the vas deferens, pain, swelling, painful nodule or scar (called sperm granuloma) or epididymtis. We discussed chronic testicular pain syndrome. This has been reported to occur in as many as 1-2% of men and may be permanent. This can be treated with medication, small procedures or (rarely) surgery. . Valium 10 mg as a preprocedure anxiolytic sent to pharmacy and he was informed he would need a driver if utilizing this medication     I, Theador Hawthorne, am acting as a scribe for Dr. Irineo Axon.  I have reviewed the above documentation for accuracy and completeness, and I agree with the above.    Riki Altes, MD    [facility] [address], [address] [place], [place] [address] [phone]

## 2020-06-25 ENCOUNTER — Other Ambulatory Visit: Payer: Self-pay

## 2020-06-25 ENCOUNTER — Ambulatory Visit (INDEPENDENT_AMBULATORY_CARE_PROVIDER_SITE_OTHER): Payer: BC Managed Care – PPO | Admitting: Urology

## 2020-06-25 DIAGNOSIS — Z3009 Encounter for other general counseling and advice on contraception: Secondary | ICD-10-CM | POA: Diagnosis not present

## 2020-06-25 NOTE — Patient Instructions (Signed)

## 2020-06-25 NOTE — Telephone Encounter (Signed)
Called and spoke to [place]. Scheduled [name] for 08/29/2019 at 1:15 pm

## 2020-06-26 ENCOUNTER — Encounter: Payer: Self-pay | Admitting: Urology

## 2020-06-29 MED ORDER — DIAZEPAM 10 MG PO TABS: ORAL_TABLET | ORAL | 0 refills | Status: DC

## 2020-08-26 ENCOUNTER — Ambulatory Visit (INDEPENDENT_AMBULATORY_CARE_PROVIDER_SITE_OTHER): Payer: BC Managed Care – PPO | Admitting: Urology

## 2020-08-26 ENCOUNTER — Encounter: Payer: Self-pay | Admitting: Urology

## 2020-08-26 ENCOUNTER — Other Ambulatory Visit: Payer: Self-pay

## 2020-08-26 VITALS — BP 110/75 | HR 92 | Ht 67.0 in | Wt 160.0 lb

## 2020-08-26 DIAGNOSIS — Z302 Encounter for sterilization: Secondary | ICD-10-CM | POA: Diagnosis not present

## 2020-08-26 MED ORDER — HYDROCODONE-ACETAMINOPHEN 5-325 MG PO TABS: 1.0000 | ORAL_TABLET | ORAL | 0 refills | Status: DC | PRN

## 2020-08-26 NOTE — Patient Instructions (Signed)

## 2020-08-26 NOTE — Progress Notes (Signed)
Vasectomy Procedure Note  Indications: The patient is a 45 y.o. male who presents today for elective sterilization.  He has been consented for the procedure.  He is aware of the risks and benefits.  He had no additional questions.  He agrees to proceed.  He denies any other significant change since his last visit.  Pre-operative Diagnosis: Elective sterilization  Post-operative Diagnosis: Elective sterilization  Premedication: Valium 10 mg po  Surgeon: Lorin Picket C. Hagop Mccollam, M.D  Description: The patient was prepped and draped in the standard fashion.  The right vas deferens was identified and brought superiorly to the anterior scrotal skin.  The skin and vas was then anesthetized utilizing 5 ml 1% lidocaine.  A small stab incision was made and spread with the vas dissector.  The vas was grasped utilizing the vas clamp and elevated out of the incision.  The vas was dissected free from surrounding tissue and vessels and an ~1 cm segment was excised.  The vas lumens were cauterized utilizing electrocautery.  The distal segment was buried in the surrounding sheath with a 3-0 chromic suture.  No significant bleeding was observed.  The vas ends were then dropped back into the hemiscrotum.  The skin was closed with hemostatic pressure.  An identical procedure was performed on the contralateral side.  Clean dry gauze was applied to the incision sites.  The patient tolerated the procedure well.  Complications:None  Recommendations: 1.  No lifting greater than 10 pounds or strenuousactivity for 1 week. 2.  Scrotal support for 1 week. 3.  Shower only for 1 week; may shower in the morning 4.  May resume intercourse in one week if no significant discomfort.  Continue alternate contraception for 12 weeks.  5.  Call for significant pain, swelling, redness, drainage or fever greater than 100.5. 6.  Rx hydrocodone/APAP 5/325 1-2 every 6 hours as needed for pain. 7.  Follow-up semen analysis in 12  weeks.   Irineo Axon, MD

## 2020-08-28 ENCOUNTER — Other Ambulatory Visit: Payer: Self-pay

## 2020-08-28 ENCOUNTER — Telehealth (INDEPENDENT_AMBULATORY_CARE_PROVIDER_SITE_OTHER): Payer: BC Managed Care – PPO | Admitting: Family Medicine

## 2020-08-28 DIAGNOSIS — F419 Anxiety disorder, unspecified: Secondary | ICD-10-CM

## 2020-08-28 NOTE — Assessment & Plan Note (Signed)
Well-controlled on Lexapro 10 mg once daily.  He is having some side effects with difficulty orgasming.  I offered to switch him to an alternative medication though he would like to stay on the Lexapro as he has otherwise tolerated it well and he just had a vasectomy so after a couple of months they will be able to go back to using no condoms.

## 2020-08-28 NOTE — Progress Notes (Signed)
Virtual Visit via video Note  This visit type was conducted due to national recommendations for restrictions regarding the COVID-19 pandemic (e.g. social distancing).  This format is felt to be most appropriate for this patient at this time.  All issues noted in this document were discussed and addressed.  No physical exam was performed (except for noted visual exam findings with Video Visits).   I connected with Tyler Salinas today at  1:15 PM EST by a video enabled telemedicine application and verified that I am speaking with the correct person using two identifiers. Location patient: home Location provider: work Persons participating in the virtual visit: patient, provider  I discussed the limitations, risks, security and privacy concerns of performing an evaluation and management service by telephone and the availability of in person appointments. I also discussed with the patient that there may be a patient responsible charge related to this service. The patient expressed understanding and agreed to proceed.  Reason for visit: f/u  HPI: Anxiety: Patient notes this has been relatively well controlled.  He has been on break from work and notes he does not have external stressors.  He tried hydroxyzine though had side effects related to this.  We started him on Lexapro and he notes he has been tolerating that well with the exception of difficulty orgasming though he does note he and his wife are using condoms as she no longer has an IUD and notes that has led to some decreased sensitivity and that may be contributing as well.   ROS: See pertinent positives and negatives per HPI.  Past Medical History:  Diagnosis Date  . Chickenpox   . Headache   . Rectal bleeding   . Vertigo     Past Surgical History:  Procedure Laterality Date  . COLONOSCOPY WITH PROPOFOL N/A 08/06/2018   Procedure: COLONOSCOPY WITH PROPOFOL;  Surgeon: Toney Reil, MD;  Location: [facility] ENDOSCOPY;  Service:  Gastroenterology;  Laterality: N/A;    Family History  Problem Relation Age of Onset  . Diabetes Other        Grandparent    SOCIAL HX: Former smoker   Current Outpatient Medications:  .  diazepam (VALIUM) 10 MG tablet, 1 tab po 30 min prior to procedure, Disp: 1 tablet, Rfl: 0 .  escitalopram (LEXAPRO) 10 MG tablet, Take 1 tablet (10 mg total) by mouth daily., Disp: 30 tablet, Rfl: 3 .  HYDROcodone-acetaminophen (NORCO/VICODIN) 5-325 MG tablet, Take 1 tablet by mouth every 4 (four) hours as needed for moderate pain., Disp: 8 tablet, Rfl: 0 .  ibuprofen (ADVIL,MOTRIN) 200 MG tablet, Take by mouth., Disp: , Rfl:  .  meclizine (ANTIVERT) 25 MG tablet, Take by mouth., Disp: , Rfl:   EXAM:  VITALS per patient if applicable:  GENERAL: alert, oriented, appears well and in no acute distress  HEENT: atraumatic, conjunttiva clear, no obvious abnormalities on inspection of external nose and ears  NECK: normal movements of the head and neck  LUNGS: on inspection no signs of respiratory distress, breathing rate appears normal, no obvious gross SOB, gasping or wheezing  CV: no obvious cyanosis  MS: moves all visible extremities without noticeable abnormality  PSYCH/NEURO: pleasant and cooperative, no obvious depression or anxiety, speech and thought processing grossly intact  ASSESSMENT AND PLAN:  Discussed the following assessment and plan:  Problem List Items Addressed This Visit    Anxiety    Well-controlled on Lexapro 10 mg once daily.  He is having some side effects with  difficulty orgasming.  I offered to switch him to an alternative medication though he would like to stay on the Lexapro as he has otherwise tolerated it well and he just had a vasectomy so after a couple of months they will be able to go back to using no condoms.          I discussed the assessment and treatment plan with the patient. The patient was provided an opportunity to ask questions and all were  answered. The patient agreed with the plan and demonstrated an understanding of the instructions.   The patient was advised to call back or seek an in-person evaluation if the symptoms worsen or if the condition fails to improve as anticipated.  Marikay Alar, MD

## 2020-10-19 ENCOUNTER — Encounter: Payer: Self-pay | Admitting: Family Medicine

## 2020-11-01 ENCOUNTER — Other Ambulatory Visit: Payer: Self-pay | Admitting: Family Medicine

## 2020-11-23 ENCOUNTER — Other Ambulatory Visit: Payer: Self-pay

## 2020-11-23 DIAGNOSIS — Z302 Encounter for sterilization: Secondary | ICD-10-CM

## 2020-11-24 ENCOUNTER — Other Ambulatory Visit: Payer: Self-pay

## 2020-11-24 ENCOUNTER — Other Ambulatory Visit: Payer: BC Managed Care – PPO

## 2020-11-24 DIAGNOSIS — Z302 Encounter for sterilization: Secondary | ICD-10-CM | POA: Diagnosis not present

## 2020-11-25 LAB — POST-VAS SPERM EVALUATION,QUAL: Volume: 6 mL

## 2020-11-30 ENCOUNTER — Telehealth: Payer: Self-pay | Admitting: *Deleted

## 2020-11-30 NOTE — Telephone Encounter (Signed)
Notified patient as instructed,Return in one month . Apt made

## 2020-11-30 NOTE — Telephone Encounter (Signed)
-----   Message from Riki Altes, MD sent at 11/28/2020 11:25 AM EDT ----- Semen sample showed sperm present.  The initial semen check does not determine motility however it is not uncommon to have immotile sperm on the initial check.  Continue alternate contraception and repeat semen analysis 1 month

## 2020-12-18 DIAGNOSIS — F909 Attention-deficit hyperactivity disorder, unspecified type: Secondary | ICD-10-CM | POA: Diagnosis not present

## 2020-12-18 DIAGNOSIS — F419 Anxiety disorder, unspecified: Secondary | ICD-10-CM | POA: Diagnosis not present

## 2020-12-31 ENCOUNTER — Other Ambulatory Visit: Payer: BC Managed Care – PPO

## 2020-12-31 ENCOUNTER — Other Ambulatory Visit: Payer: Self-pay

## 2020-12-31 ENCOUNTER — Other Ambulatory Visit: Payer: Self-pay | Admitting: *Deleted

## 2020-12-31 DIAGNOSIS — Z9852 Vasectomy status: Secondary | ICD-10-CM

## 2021-01-01 LAB — POST-VAS SPERM EVALUATION,QUAL: Volume: 3.3 mL

## 2021-01-08 ENCOUNTER — Encounter: Payer: Self-pay | Admitting: Urology

## 2021-01-11 ENCOUNTER — Telehealth: Payer: Self-pay | Admitting: *Deleted

## 2021-01-11 NOTE — Telephone Encounter (Signed)
Patient to come by the office to pick up his infor. Advised him what to do .

## 2021-01-11 NOTE — Telephone Encounter (Signed)
-----   Message from Riki Altes, MD sent at 01/08/2021 12:30 PM EDT ----- I sent the patient a MyChart message.  Please schedule AUA postvasectomy semen sample that looks at motility and count

## 2021-02-02 ENCOUNTER — Other Ambulatory Visit: Payer: Self-pay | Admitting: Urology

## 2021-02-02 DIAGNOSIS — Z9852 Vasectomy status: Secondary | ICD-10-CM | POA: Diagnosis not present

## 2021-02-04 LAB — POST VAS SEMEN ANALYSIS, AUA: Volume: 4.7 mL

## 2021-02-08 ENCOUNTER — Telehealth: Payer: BC Managed Care – PPO | Admitting: *Deleted

## 2021-02-08 NOTE — Telephone Encounter (Signed)
-----   Message from Riki Altes, MD sent at 02/08/2021  8:57 AM EDT ----- Semen analysis showed no sperm present.  Okay to use vasectomy as primary contraception.

## 2021-02-08 NOTE — Telephone Encounter (Signed)
Notified patient as instructed, patient pleased °

## 2021-02-16 DIAGNOSIS — F909 Attention-deficit hyperactivity disorder, unspecified type: Secondary | ICD-10-CM | POA: Diagnosis not present

## 2021-02-16 DIAGNOSIS — F419 Anxiety disorder, unspecified: Secondary | ICD-10-CM | POA: Diagnosis not present

## 2021-02-23 DIAGNOSIS — F419 Anxiety disorder, unspecified: Secondary | ICD-10-CM | POA: Diagnosis not present

## 2021-02-23 DIAGNOSIS — F909 Attention-deficit hyperactivity disorder, unspecified type: Secondary | ICD-10-CM | POA: Diagnosis not present

## 2021-02-28 ENCOUNTER — Other Ambulatory Visit: Payer: Self-pay | Admitting: Family Medicine

## 2021-03-31 DIAGNOSIS — F419 Anxiety disorder, unspecified: Secondary | ICD-10-CM | POA: Diagnosis not present

## 2021-03-31 DIAGNOSIS — F909 Attention-deficit hyperactivity disorder, unspecified type: Secondary | ICD-10-CM | POA: Diagnosis not present

## 2021-12-03 ENCOUNTER — Other Ambulatory Visit: Payer: Self-pay | Admitting: Family Medicine

## 2022-05-29 ENCOUNTER — Other Ambulatory Visit: Payer: Self-pay | Admitting: Family Medicine

## 2022-05-30 NOTE — Telephone Encounter (Signed)
LMTCB. Pt has not been seen in over a year.

## 2022-06-06 ENCOUNTER — Encounter: Payer: Self-pay | Admitting: Family Medicine

## 2022-06-06 ENCOUNTER — Telehealth (INDEPENDENT_AMBULATORY_CARE_PROVIDER_SITE_OTHER): Payer: BC Managed Care – PPO | Admitting: Family Medicine

## 2022-06-06 DIAGNOSIS — F419 Anxiety disorder, unspecified: Secondary | ICD-10-CM

## 2022-06-06 MED ORDER — ESCITALOPRAM OXALATE 10 MG PO TABS: 10.0000 mg | ORAL_TABLET | Freq: Every day | ORAL | 1 refills | Status: DC

## 2022-06-06 NOTE — Assessment & Plan Note (Signed)
Well-controlled.  He can continue Lexapro 10 mg once daily.  I did discuss there are other options to consider that may be less likely to cause the side effects he is having.  I encouraged him to discuss his options with a new provider when he gets set up with them.

## 2022-06-06 NOTE — Progress Notes (Signed)
Virtual Visit via video Note  This visit type was conducted due to national recommendations for restrictions regarding the COVID-19 pandemic (e.g. social distancing).  This format is felt to be most appropriate for this patient at this time.  All issues noted in this document were discussed and addressed.  No physical exam was performed (except for noted visual exam findings with Video Visits).   I connected with Tyler Salinas today at  3:15 PM EDT by a video enabled telemedicine application and verified that I am speaking with the correct person using two identifiers. Location patient: work Location provider: work Persons participating in the virtual visit: patient, provider  I discussed the limitations, risks, security and privacy concerns of performing an evaluation and management service by telephone and the availability of in person appointments. I also discussed with the patient that there may be a patient responsible charge related to this service. The patient expressed understanding and agreed to proceed.  Reason for visit: f/u  HPI: Anxiety: Patient notes this is manageable.  There have been a couple of times recently where he is felt like he was going to have a panic attack though it is manageable.  He notes no depression, SI, or HI.  He notes some slight sexual side effects and occasional diarrhea with the Lexapro.  He notes neither of those are bad enough to switch the medication at this time.  He is looking for a new provider in [place] where he and his family moved.   ROS: See pertinent positives and negatives per HPI.  Past Medical History:  Diagnosis Date   Chickenpox    Headache    Rectal bleeding    Vertigo     Past Surgical History:  Procedure Laterality Date   COLONOSCOPY WITH PROPOFOL N/A 08/06/2018   Procedure: COLONOSCOPY WITH PROPOFOL;  Surgeon: Toney Reil, MD;  Location: [facility];  Service: Gastroenterology;  Laterality: N/A;    Family  History  Problem Relation Age of Onset   Diabetes Other        Grandparent    SOCIAL HX: Former smoker   Current Outpatient Medications:    ibuprofen (ADVIL,MOTRIN) 200 MG tablet, Take by mouth., Disp: , Rfl:    escitalopram (LEXAPRO) 10 MG tablet, Take 1 tablet (10 mg total) by mouth daily., Disp: 90 tablet, Rfl: 1  EXAM:  VITALS per patient if applicable:  GENERAL: alert, oriented, appears well and in no acute distress  HEENT: atraumatic, conjunttiva clear, no obvious abnormalities on inspection of external nose and ears  NECK: normal movements of the head and neck  LUNGS: on inspection no signs of respiratory distress, breathing rate appears normal, no obvious gross SOB, gasping or wheezing  CV: no obvious cyanosis  MS: moves all visible extremities without noticeable abnormality  PSYCH/NEURO: pleasant and cooperative, no obvious depression or anxiety, speech and thought processing grossly intact  ASSESSMENT AND PLAN:  Discussed the following assessment and plan:  Problem List Items Addressed This Visit     Anxiety (Chronic)    Well-controlled.  He can continue Lexapro 10 mg once daily.  I did discuss there are other options to consider that may be less likely to cause the side effects he is having.  I encouraged him to discuss his options with a new provider when he gets set up with them.      Relevant Medications   escitalopram (LEXAPRO) 10 MG tablet    No follow-ups on file.   I discussed the  assessment and treatment plan with the patient. The patient was provided an opportunity to ask questions and all were answered. The patient agreed with the plan and demonstrated an understanding of the instructions.   The patient was advised to call back or seek an in-person evaluation if the symptoms worsen or if the condition fails to improve as anticipated.   Tommi Rumps, MD

## 2022-08-05 DIAGNOSIS — Z0000 Encounter for general adult medical examination without abnormal findings: Secondary | ICD-10-CM | POA: Diagnosis not present

## 2022-08-05 DIAGNOSIS — Z13220 Encounter for screening for lipoid disorders: Secondary | ICD-10-CM | POA: Diagnosis not present

## 2022-08-05 DIAGNOSIS — Z1159 Encounter for screening for other viral diseases: Secondary | ICD-10-CM | POA: Diagnosis not present

## 2022-08-05 DIAGNOSIS — Z139 Encounter for screening, unspecified: Secondary | ICD-10-CM | POA: Diagnosis not present

## 2022-10-21 DIAGNOSIS — D229 Melanocytic nevi, unspecified: Secondary | ICD-10-CM | POA: Diagnosis not present

## 2022-10-21 DIAGNOSIS — D2330 Other benign neoplasm of skin of unspecified part of face: Secondary | ICD-10-CM | POA: Diagnosis not present

## 2022-11-27 ENCOUNTER — Other Ambulatory Visit: Payer: Self-pay | Admitting: Family Medicine

## 2022-11-29 DIAGNOSIS — H538 Other visual disturbances: Secondary | ICD-10-CM | POA: Diagnosis not present

## 2023-03-05 ENCOUNTER — Other Ambulatory Visit: Payer: Self-pay | Admitting: Family Medicine

## 2023-07-20 DIAGNOSIS — J029 Acute pharyngitis, unspecified: Secondary | ICD-10-CM | POA: Diagnosis not present

## 2023-07-20 DIAGNOSIS — U071 COVID-19: Secondary | ICD-10-CM | POA: Diagnosis not present

## 2023-08-07 DIAGNOSIS — Z1211 Encounter for screening for malignant neoplasm of colon: Secondary | ICD-10-CM | POA: Diagnosis not present

## 2023-08-07 DIAGNOSIS — Z0000 Encounter for general adult medical examination without abnormal findings: Secondary | ICD-10-CM | POA: Diagnosis not present

## 2023-08-07 DIAGNOSIS — Z1330 Encounter for screening examination for mental health and behavioral disorders, unspecified: Secondary | ICD-10-CM | POA: Diagnosis not present

## 2023-08-07 DIAGNOSIS — Z6828 Body mass index (BMI) 28.0-28.9, adult: Secondary | ICD-10-CM | POA: Diagnosis not present

## 2023-08-07 DIAGNOSIS — F325 Major depressive disorder, single episode, in full remission: Secondary | ICD-10-CM | POA: Diagnosis not present

## 2023-08-07 DIAGNOSIS — Z23 Encounter for immunization: Secondary | ICD-10-CM | POA: Diagnosis not present

## 2023-08-07 DIAGNOSIS — E785 Hyperlipidemia, unspecified: Secondary | ICD-10-CM | POA: Diagnosis not present

## 2023-08-07 DIAGNOSIS — F411 Generalized anxiety disorder: Secondary | ICD-10-CM | POA: Diagnosis not present

## 2023-11-01 ENCOUNTER — Telehealth: Payer: Self-pay | Admitting: Family Medicine

## 2023-11-01 NOTE — Telephone Encounter (Signed)
 Dr Birdie Sons is no longer at this location. Please call the office to schedule a Transfer of Care to either Dr Charlann Lange, Darleen Crocker or Kara Dies, NP. [id number] please schedule.  Thank you
